# Patient Record
Sex: Male | Born: 1970 | Race: Black or African American | Hispanic: No | Smoking: Never smoker
Health system: Southern US, Community
[De-identification: ages and names within clinical notes are randomized; demographics above are authoritative.]

## PROBLEM LIST (undated history)

## (undated) DIAGNOSIS — D66 Hereditary factor VIII deficiency: Secondary | ICD-10-CM

## (undated) DIAGNOSIS — Z9989 Dependence on other enabling machines and devices: Secondary | ICD-10-CM

## (undated) DIAGNOSIS — E0500 Thyrotoxicosis with diffuse goiter without thyrotoxic crisis or storm: Secondary | ICD-10-CM

## (undated) DIAGNOSIS — E785 Hyperlipidemia, unspecified: Secondary | ICD-10-CM

## (undated) DIAGNOSIS — G4733 Obstructive sleep apnea (adult) (pediatric): Secondary | ICD-10-CM

## (undated) DIAGNOSIS — I10 Essential (primary) hypertension: Secondary | ICD-10-CM

## (undated) DIAGNOSIS — N200 Calculus of kidney: Secondary | ICD-10-CM

## (undated) DIAGNOSIS — R0600 Dyspnea, unspecified: Secondary | ICD-10-CM

## (undated) HISTORY — PX: TONSILLECTOMY: SUR1361

## (undated) HISTORY — DX: Hyperlipidemia, unspecified: E78.5

## (undated) HISTORY — DX: Hereditary factor VIII deficiency: D66

## (undated) HISTORY — DX: Essential (primary) hypertension: I10

## (undated) HISTORY — DX: Obstructive sleep apnea (adult) (pediatric): G47.33

## (undated) HISTORY — DX: Dependence on other enabling machines and devices: Z99.89

## (undated) HISTORY — DX: Dyspnea, unspecified: R06.00

---

## 2001-04-12 ENCOUNTER — Emergency Department (HOSPITAL_COMMUNITY): Admission: EM | Admit: 2001-04-12 | Discharge: 2001-04-12 | Payer: Self-pay | Admitting: Emergency Medicine

## 2001-04-12 ENCOUNTER — Encounter: Payer: Self-pay | Admitting: Emergency Medicine

## 2001-12-02 ENCOUNTER — Emergency Department (HOSPITAL_COMMUNITY): Admission: EM | Admit: 2001-12-02 | Discharge: 2001-12-02 | Payer: Self-pay | Admitting: Emergency Medicine

## 2001-12-02 ENCOUNTER — Encounter: Payer: Self-pay | Admitting: Emergency Medicine

## 2003-08-26 ENCOUNTER — Emergency Department (HOSPITAL_COMMUNITY): Admission: EM | Admit: 2003-08-26 | Discharge: 2003-08-26 | Payer: Self-pay | Admitting: Emergency Medicine

## 2003-08-30 ENCOUNTER — Emergency Department (HOSPITAL_COMMUNITY): Admission: EM | Admit: 2003-08-30 | Discharge: 2003-08-30 | Payer: Self-pay | Admitting: Family Medicine

## 2004-06-02 ENCOUNTER — Emergency Department (HOSPITAL_COMMUNITY): Admission: EM | Admit: 2004-06-02 | Discharge: 2004-06-02 | Payer: Self-pay | Admitting: Emergency Medicine

## 2005-02-18 ENCOUNTER — Encounter: Admission: RE | Admit: 2005-02-18 | Discharge: 2005-02-18 | Payer: Self-pay | Admitting: Orthopedic Surgery

## 2005-10-25 ENCOUNTER — Emergency Department (HOSPITAL_COMMUNITY): Admission: EM | Admit: 2005-10-25 | Discharge: 2005-10-25 | Payer: Self-pay | Admitting: *Deleted

## 2005-12-27 ENCOUNTER — Emergency Department (HOSPITAL_COMMUNITY): Admission: EM | Admit: 2005-12-27 | Discharge: 2005-12-27 | Payer: Self-pay | Admitting: Family Medicine

## 2005-12-27 ENCOUNTER — Ambulatory Visit (HOSPITAL_COMMUNITY): Admission: RE | Admit: 2005-12-27 | Discharge: 2005-12-27 | Payer: Self-pay | Admitting: Family Medicine

## 2006-01-03 ENCOUNTER — Emergency Department (HOSPITAL_COMMUNITY): Admission: EM | Admit: 2006-01-03 | Discharge: 2006-01-03 | Payer: Self-pay | Admitting: Emergency Medicine

## 2006-10-06 ENCOUNTER — Emergency Department (HOSPITAL_COMMUNITY): Admission: EM | Admit: 2006-10-06 | Discharge: 2006-10-06 | Payer: Self-pay | Admitting: Family Medicine

## 2007-05-13 ENCOUNTER — Emergency Department (HOSPITAL_COMMUNITY): Admission: EM | Admit: 2007-05-13 | Discharge: 2007-05-13 | Payer: Self-pay | Admitting: Family Medicine

## 2008-04-13 ENCOUNTER — Ambulatory Visit: Payer: Self-pay | Admitting: Internal Medicine

## 2008-04-13 DIAGNOSIS — G4733 Obstructive sleep apnea (adult) (pediatric): Secondary | ICD-10-CM | POA: Insufficient documentation

## 2008-05-03 ENCOUNTER — Ambulatory Visit (HOSPITAL_COMMUNITY): Admission: RE | Admit: 2008-05-03 | Discharge: 2008-05-03 | Payer: Self-pay | Admitting: Interventional Cardiology

## 2008-05-04 DIAGNOSIS — I119 Hypertensive heart disease without heart failure: Secondary | ICD-10-CM | POA: Insufficient documentation

## 2008-05-04 DIAGNOSIS — E785 Hyperlipidemia, unspecified: Secondary | ICD-10-CM | POA: Insufficient documentation

## 2008-05-05 ENCOUNTER — Encounter: Payer: Self-pay | Admitting: Internal Medicine

## 2008-05-05 ENCOUNTER — Ambulatory Visit (HOSPITAL_BASED_OUTPATIENT_CLINIC_OR_DEPARTMENT_OTHER): Admission: RE | Admit: 2008-05-05 | Discharge: 2008-05-05 | Payer: Self-pay | Admitting: Internal Medicine

## 2008-05-10 ENCOUNTER — Encounter (INDEPENDENT_AMBULATORY_CARE_PROVIDER_SITE_OTHER): Payer: Self-pay | Admitting: Interventional Cardiology

## 2008-05-10 ENCOUNTER — Ambulatory Visit (HOSPITAL_COMMUNITY): Admission: RE | Admit: 2008-05-10 | Discharge: 2008-05-10 | Payer: Self-pay | Admitting: Internal Medicine

## 2008-05-12 ENCOUNTER — Ambulatory Visit: Payer: Self-pay | Admitting: Internal Medicine

## 2008-05-17 ENCOUNTER — Ambulatory Visit: Payer: Self-pay | Admitting: Internal Medicine

## 2008-05-30 ENCOUNTER — Encounter: Payer: Self-pay | Admitting: Internal Medicine

## 2008-06-06 ENCOUNTER — Telehealth: Payer: Self-pay | Admitting: Internal Medicine

## 2008-06-21 ENCOUNTER — Ambulatory Visit: Payer: Self-pay | Admitting: Internal Medicine

## 2008-06-21 DIAGNOSIS — D66 Hereditary factor VIII deficiency: Secondary | ICD-10-CM

## 2008-10-22 ENCOUNTER — Ambulatory Visit: Payer: Self-pay | Admitting: Internal Medicine

## 2008-12-11 ENCOUNTER — Encounter: Payer: Self-pay | Admitting: Internal Medicine

## 2009-08-31 ENCOUNTER — Emergency Department (HOSPITAL_COMMUNITY): Admission: EM | Admit: 2009-08-31 | Discharge: 2009-09-01 | Payer: Self-pay | Admitting: Emergency Medicine

## 2009-09-05 ENCOUNTER — Encounter: Payer: Self-pay | Admitting: Internal Medicine

## 2009-10-23 ENCOUNTER — Telehealth: Payer: Self-pay | Admitting: Internal Medicine

## 2009-10-25 ENCOUNTER — Ambulatory Visit: Payer: Self-pay | Admitting: Internal Medicine

## 2009-10-25 DIAGNOSIS — R0602 Shortness of breath: Secondary | ICD-10-CM | POA: Insufficient documentation

## 2009-11-01 ENCOUNTER — Emergency Department (HOSPITAL_COMMUNITY): Admission: EM | Admit: 2009-11-01 | Discharge: 2009-11-01 | Payer: Self-pay | Admitting: Emergency Medicine

## 2009-12-09 ENCOUNTER — Telehealth: Payer: Self-pay | Admitting: Internal Medicine

## 2010-01-07 ENCOUNTER — Ambulatory Visit: Payer: Self-pay | Admitting: Internal Medicine

## 2010-02-24 ENCOUNTER — Ambulatory Visit: Payer: Self-pay | Admitting: Internal Medicine

## 2010-02-25 ENCOUNTER — Telehealth: Payer: Self-pay | Admitting: Adult Health

## 2010-02-27 ENCOUNTER — Ambulatory Visit: Payer: Self-pay | Admitting: Internal Medicine

## 2010-03-26 ENCOUNTER — Emergency Department (HOSPITAL_COMMUNITY)
Admission: EM | Admit: 2010-03-26 | Discharge: 2010-03-26 | Payer: Self-pay | Source: Home / Self Care | Admitting: Family Medicine

## 2010-04-15 NOTE — Assessment & Plan Note (Signed)
Summary: rov ///kp   Primary Provider/Referring Provider:  Camillo Flaming, jr  CC:  2 month follow up, cpap averages 4 hrs per night, BP  elevated, and pt c/o haeadaches.  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 Gets this also with season changes. Changes sides. Using a full face mask. Pressure is comfortable. Denies pollen related trouble. He states he is still falling asleep some in class although he is trying to use cpap 8 hours/night.  10/22/08- OSA, hemophilia Has noted big improvement in daytime alertnes, concentration and absence of morning headache using CPAP., He is used to it. Mask and pressure are comfortable.  October 25, 2009- OSA, hemophilia He wears CPAP every night. He sleeps 4 hours, gets up for 2 hours, then back to sleep another 4 hours. If he doesn't do this he says he is too groggy to function. Bedtime around 10PM. He doesn't know that he snores with CPAP, and says the seal is good. On a good day he still fights sleepiness. Dreams with sleep paralysis at times, but no cataplexy.Caffeine has no effect. Tried to start running and says lungs feel as if they " are in a box" without cough or wheeze. Never smoked, no lung disease. Denies chest pain or palpitation.   January 07, 2010- OSA, hemoptysis Nurse-CC: 2 month follow up, cpap averages 4 hrs per night, BP  elevated, pt c/o haeadaches Still pulls mask off but still thinks he averages about 4 hours/ night wearing CPAP and he can tell he feels better and more alert from using it.. Needs to lie on a wedge to avoid smothering I can't tell that he chokes, refluxes or wheezes while supine. . Today getting headaches and it raises his BP. This is not new. He claims BP will come right down as HA eases. We discussed sleep, headache and HBP.     Preventive Screening-Counseling & Management  Alcohol-Tobacco     Smoking Status: never  Current Medications (verified): 1)  Cpap 19  Advanced 2)  Hydrochlorothiazide 25 Mg Tabs (Hydrochlorothiazide) .... Take 1 By Mouth Once Daily 3)  Tricor 48 Mg Tabs (Fenofibrate) .... Take 1 By Mouth Once Daily 4)  Furosemide 20 Mg Tabs (Furosemide) .... Take 1 By Mouth Once Daily 5)  Pravachol 40 Mg Tabs (Pravastatin Sodium) .... Take 1 By Mouth Once Daily 6)  Amphetamine-Dextroamphetamine 30 Mg Xr24h-Cap (Amphetamine-Dextroamphetamine) .Marland Kitchen.. 1 Daily As Needed  Allergies (verified): No Known Drug Allergies  Past History:  Past Medical History: Last updated: 10/25/2009 Hyperlipidemia Hypertension hemophilia ObstructiveSleep Apnea- NPSG 05/05/08- AHI 92.5; CPAP 19/ AHI 0/hr Dyspnea  Past Surgical History: Last updated: 04/13/2008 Tonsillectomy  Family History: Last updated: 04/13/2008 parents living-2010 Nephew with sleep apnea  Social History: Last updated: 05/17/2008 Patient never smoked.  Separated In school- Actuary  [employer]  Risk Factors: Smoking Status: never (01/07/2010)  Review of Systems      See HPI  Vital Signs:  Patient profile:   40 year old male Height:      66 inches Weight:      241.8 pounds BMI:     39.17 O2 Sat:      97 % on Room air Pulse rate:   76 / minute BP sitting:   162 / 104  (left arm) Cuff size:   large  Vitals Entered By: Renold Genta RCP, LPN (January 07, 2010 11:12 AM)  O2 Flow:  Room air CC: 2 month follow up, cpap averages 4  hrs per night, BP  elevated, pt c/o haeadaches Comments Medications reviewed with patient Renold Genta RCP, LPN  January 07, 2010 11:15 AM    Physical Exam  Additional Exam:  General: A/Ox3; pleasant and cooperative, NAD, moderately overweight, quite alert and conversational   note BP 162/104 blamed on headache SKIN: no rash, lesions NODES: no lymphadenopathy HEENT: [place]/AT, EOM- WNL, Conjuctivae- clear, PERRLA, TM-WNL, Nose- clear, Throat- clear and wnl. Mallampti III- IV NECK: Supple w/ fair ROM, JVD- none, normal carotid  impulses w/o bruits Thyroid- CHEST: Clear to P&A HEART: RRR, no m/g/r heard ABDOMEN: Soft and nl;  ZOX:WRUE, nl pulses, no edema  NEURO: Grossly intact to observation.      Impression & Recommendations:  Problem # 1:  HYPERSOMNIA WITH SLEEP APNEA UNSPECIFIED (ICD-780.53)  We will continue CPAP at 19. I am going to have him try samples of Nuvigil to see if it can help with alertness w/o the pressure stimulation.  I am concerned about BP he attributes to his headache today. He is to see his primary MD.  Orders: Est. Patient Level IV (45409)  Medications Added to Medication List This Visit: 1)  Nuvigil 150 Mg Tabs (Armodafinil) .Marland Kitchen.. 1-2 daily as needed for alertness  Patient Instructions: 1)  Please schedule a follow-up appointment in 1 month. 2)  Instead of Adderall, try Nuvigil 150 mg samples, 3)   1 or 2 daily if needed.  4)  Continue CPAP as much of all night every night as possible.  Prescriptions: NUVIGIL 150 MG TABS (ARMODAFINIL) 1-2 daily as needed for alertness  #1 pack x 0   Entered and Authorized by:   Waymon Budge MD   Signed by:   Waymon Budge MD on 01/07/2010   Method used:   Samples Given   RxID:   [id number]

## 2010-04-15 NOTE — Assessment & Plan Note (Signed)
Summary: rov/jd   Primary Provider/Referring Provider:  Camillo Flaming, jr  CC:  Follow up visit-CPAP usage is limited due to taking off inthe mid of night-not realizing; pt states he is sleeping for approx 4-5 hours waking up for approx 2 hours and then goes back to sleep for another 4-5 hours..  History of Present Illness: 05/17/08-OSA NPSG 05/05/08- 92.5/hr with loud snore and desat.CPAP titrated to 19 for AHI 0/hr. Discussed implications and alternatives.He is very concerned about his class performance because of fatigue.   06/21/08- OSA, hemophilia Noting epistaxis from cpap even with humidifer. Gets this also with season changes. Changes sides. Using a full face mask. Pressure is comfortable. Denies pollen related trouble. He states he is still falling asleep some in class although he is trying to use cpap 8 hours/night.  10/22/08- OSA, hemophilia Has noted big improvement in daytime alertnes, concentration and absence of morning headache using CPAP., He is used to it. Mask and pressure are comfortable.  October 25, 2009- OSA, hemophilia He wears CPAP every night. He sleeps 4 hours, gets up for 2 hours, then back to sleep another 4 hours. If he doesn't do this he says he is too groggy to function. Bedtime around 10PM. He doesn't know that he snores with CPAP, and says the seal is good. On a good day he still fights sleepiness. Dreams with sleep paralysis at times, but no cataplexy.Caffeine has no effect. Tried to start running and says lungs feel as if they " are in a box" without cough or wheeze. Never smoked, no lung disease. Denies chest pain or palpitation.   Preventive Screening-Counseling & Management  Alcohol-Tobacco     Smoking Status: never  Current Medications (verified): 1)  Cpap 19 Advanced 2)  Hydrochlorothiazide 25 Mg Tabs (Hydrochlorothiazide) .... Take 1 By Mouth Once Daily 3)  Tricor 48 Mg Tabs (Fenofibrate) .... Take 1 By Mouth Once Daily 4)  Furosemide 20 Mg Tabs  (Furosemide) .... Take 1 By Mouth Once Daily 5)  Pravachol 40 Mg Tabs (Pravastatin Sodium) .... Take 1 By Mouth Once Daily  Allergies (verified): No Known Drug Allergies  Past History:  Past Surgical History: Last updated: 04/13/2008 Tonsillectomy  Family History: Last updated: 04/13/2008 parents living-2010 Nephew with sleep apnea  Social History: Last updated: 05/17/2008 Patient never smoked.  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  The patient denies anorexia, fever, weight loss, weight gain, vision loss, decreased hearing, hoarseness, chest pain, syncope, peripheral edema, prolonged cough, headaches, hemoptysis, abdominal pain, and severe indigestion/heartburn.    Vital Signs:  Patient profile:   40 year old male Height:      66 inches Weight:      242 pounds BMI:     39.20 O2 Sat:      97 % on Room air Pulse rate:   79 / minute BP sitting:   130 / 84  (left arm) Cuff size:   large  Vitals Entered By: Reynaldo Minium CMA (October 25, 2009 2:53 PM)  O2 Flow:  Room air CC: Follow up visit-CPAP usage is limited due to taking off inthe mid of night-not realizing; pt states he is sleeping for approx 4-5 hours waking up for approx 2  hours and then goes back to sleep for another 4-5 hours.   Physical Exam  Additional Exam:  General: A/Ox3; pleasant and cooperative, NAD, moderately overweight, quite alert and conversational SKIN: no rash, lesions NODES: no lymphadenopathy HEENT: [place]/AT, EOM- WNL, Conjuctivae- clear, PERRLA, TM-WNL, Nose- clear, Throat- clear and wnl. Mallampti III- IV NECK: Supple w/ fair ROM, JVD- none, normal carotid impulses w/o bruits Thyroid- CHEST: Clear to  P&A HEART: RRR, no m/g/r heard ABDOMEN: Soft and nl;  QVZ:DGLO, nl pulses, no edema  NEURO: Grossly intact to observation. He was sound asleep when I came in the room. Alert once awake.      Impression & Recommendations:  Problem # 1:  HYPERSOMNIA WITH SLEEP APNEA UNSPECIFIED (ICD-780.53)  It sounds as if he is compliant and the CPAP pressure is adequate.  He has significant additional sleepiness which we will try addressing with adderall. I have discussed this as a controlled drug, with protential complications.  Problem # 2:  DYSPNEA (ICD-786.05)  Dyspnea with running, likely reflectrs conditioning. If it persists we can get PFT but for now we will get CXR.  Medications Added to Medication List This Visit: 1)  Hydrochlorothiazide 25 Mg Tabs (Hydrochlorothiazide) .... Take 1 by mouth once daily 2)  Tricor 48 Mg Tabs (Fenofibrate) .... Take 1 by mouth once daily 3)  Furosemide 20 Mg Tabs (Furosemide) .... Take 1 by mouth once daily 4)  Pravachol 40 Mg Tabs (Pravastatin sodium) .... Take 1 by mouth once daily 5)  Amphetamine-dextroamphetamine 30 Mg Xr24h-cap (Amphetamine-dextroamphetamine) .Marland Kitchen.. 1 daily as needed  Other Orders: Est. Patient Level IV (99214) T-2 View CXR (71020TC)  Patient Instructions: 1)  Please schedule a follow-up appointment in 1 month. 2)  Script for generic adderall. Take one each morning as needed to increase alertness. Don't take more than dose prescribed.  3)  A chest x-ray has been recommended.  Your imaging study may require preauthorization.  Prescriptions: AMPHETAMINE-DEXTROAMPHETAMINE 30 MG XR24H-CAP (AMPHETAMINE-DEXTROAMPHETAMINE) 1 daily as needed  #30 x 0   Entered and Authorized by:   Waymon Budge MD   Signed by:   Waymon Budge MD on 10/25/2009   Method used:   Print then Give to Patient   RxID:   [id number]

## 2010-04-15 NOTE — Progress Notes (Signed)
Summary: nos appt  Phone Note Call from Patient   Caller: [email or web address]  Call For: young Summary of Call: Rsc nos from 8/9 to 8/12 @ 2:15p. Initial call taken by: Darletta Moll,  October 23, 2009 9:55 AM

## 2010-04-15 NOTE — Progress Notes (Signed)
Summary: nos appt  Phone Note Call from Patient   Caller: [email or web address]  Call For: young Summary of Call: LMTCB x2 to rsc nos from 9/22. Initial call taken by: Darletta Moll,  December 09, 2009 10:59 AM

## 2010-04-15 NOTE — Letter (Signed)
Summary: CMN for CPAP Supplies/[facility]  CMN for CPAP Supplies/[facility]   Imported By: Sherian Rein 09/18/2009 13:55:56  _____________________________________________________________________  External Attachment:    Type:   Image     Comment:   External Document

## 2010-04-17 NOTE — Assessment & Plan Note (Signed)
Summary: Earl Williams   Primary Provider/Referring Provider:  Camillo Flaming, jr  CC:  Follow up visit-sleep no complaints..  History of Present Illness: October 25, 2009- OSA, hemophilia He wears CPAP every night. He sleeps 4 hours, gets up for 2 hours, then back to sleep another 4 hours. If he doesn't do this he says he is too groggy to function. Bedtime around 10PM. He doesn't know that he snores with CPAP, and says the seal is good. On a good day he still fights sleepiness. Dreams with sleep paralysis at times, but no cataplexy.Caffeine has no effect. Tried to start running and says lungs feel as if they " are in a box" without cough or wheeze. Never smoked, no lung disease. Denies chest pain or palpitation.   January 07, 2010- OSA, hemoptysis Nurse-CC: 2 month follow up, cpap averages 4 hrs per night, BP  elevated, pt c/o haeadaches Still pulls mask off but still thinks he averages about 4 hours/ night wearing CPAP and he can tell he feels better and more alert from using it.. Needs to lie on a wedge to avoid smothering I can't tell that he chokes, refluxes or wheezes while supine. . Today getting headaches and it raises his BP. This is not new. He claims BP will come right down as HA eases. We discussed sleep, headache and HBP.   February 27, 2010- OSA, hemoptysis  ............daughter here Nurse-CC: Follow up visit-sleep no complaints. Neuronitin is a defintie help and imporoives his focus and concentration. It is not causing headaches.  He continues using CPAP all night every night. He still pulls it off in sleep at times unaware.  His daughter volunteers that he pulls mask off in sleep and will start snoring, doesn't snore through it, and is not especailly restless in sleep.  He no longer shakes in sleep and has not coughed up any more blood.     Preventive Screening-Counseling & Management  Alcohol-Tobacco     Smoking Status: never  Current Medications (verified): 1)  Cpap 19  Advanced 2)  Hydrochlorothiazide 25 Mg Tabs (Hydrochlorothiazide) .... Take 1 By Mouth Once Daily 3)  Tricor 48 Mg Tabs (Fenofibrate) .... Take 1 By Mouth Once Daily 4)  Furosemide 20 Mg Tabs (Furosemide) .... Take 1 By Mouth Once Daily 5)  Pravachol 40 Mg Tabs (Pravastatin Sodium) .... Take 1 By Mouth Once Daily 6)  Nuvigil 150 Mg Tabs (Armodafinil) .Marland Kitchen.. 1-2 Daily As Needed For Alertness  Allergies (verified): No Known Drug Allergies  Past History:  Past Medical History: Last updated: 10/25/2009 Hyperlipidemia Hypertension hemophilia ObstructiveSleep Apnea- NPSG 05/05/08- AHI 92.5; CPAP 19/ AHI 0/hr Dyspnea  Past Surgical History: Last updated: 04/13/2008 Tonsillectomy  Family History: Last updated: 04/13/2008 parents living-2010 Nephew with sleep apnea  Social History: Last updated: 05/17/2008 Patient never smoked.  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    Vital Signs:  Patient profile:   40 year old male Height:      66 inches Weight:      247.38 pounds BMI:     40.07 O2 Sat:      97 %  on Room air Pulse rate:   76 / minute BP sitting:   148 / 90  (left arm) Cuff size:   large  Vitals Entered By: Reynaldo Minium CMA (February 27, 2010 4:59 PM)  O2 Flow:  Room air CC: Follow up visit-sleep no complaints.   Physical Exam  Additional Exam:  General: A/Ox3; pleasant and cooperative, NAD, moderately overweight, quite alert and conversational   SKIN: no rash, lesions NODES: no  lymphadenopathy HEENT: [place]/AT, EOM- WNL, Conjuctivae- clear, PERRLA, TM-WNL, Nose- clear, Throat- clear and wnl. Mallampti III- IV NECK: Supple w/ fair ROM, JVD- none, normal carotid impulses w/o bruits Thyroid- CHEST: Clear to P&A HEART: RRR, no m/g/r heard ABDOMEN: Soft and nl;  GEX:BMWU, nl pulses, no edema  NEURO: Grossly intact to observation.      Impression & Recommendations:  Problem # 1:  HYPERSOMNIA WITH SLEEP APNEA UNSPECIFIED (ICD-780.53)  Now doing very well with voluntary compliance and control. We  will let him try temazepam at night to see if that makes him more relaxed so he doesn't pull on mask. He will continue Neuronitn for daytime use if needed.   Problem # 2:  DYSPNEA (ICD-786.05)  Dyspnea, choking and hemoptysis have stopped.   Medications Added to Medication List This Visit: 1)  Temazepam 15 Mg Caps (Temazepam) .Marland Kitchen.. 1 for sleep if needed  Other Orders: Est. Patient Level III (13244)  Patient Instructions: 1)  Please schedule a follow-up appointment in 6 months. 2)  Continue CPAP all night every night at 19 3)  Try script Temazepam to help you sleep a little more soundly. See if that reduces your tendency to pull off your mask so much.  Prescriptions: TEMAZEPAM 15 MG CAPS (TEMAZEPAM) 1 for sleep if needed  #30 x 5   Entered and Authorized by:   Waymon Budge MD   Signed by:   Waymon Budge MD on 02/27/2010   Method used:   Print then Give to Patient   RxID:   [id number]

## 2010-04-17 NOTE — Progress Notes (Signed)
Summary: nos appt  Phone Note Call from Patient   Caller: [email or web address]  Call For: young Summary of Call: Rsc nos from 12/12 to 12/15. Initial call taken by: Darletta Moll,  February 25, 2010 4:02 PM

## 2010-05-25 ENCOUNTER — Emergency Department (HOSPITAL_COMMUNITY)
Admission: EM | Admit: 2010-05-25 | Discharge: 2010-05-25 | Disposition: A | Discharge status: Home / Self Care | Payer: Medicaid Other | Attending: Emergency Medicine | Admitting: Emergency Medicine

## 2010-05-25 DIAGNOSIS — I10 Essential (primary) hypertension: Secondary | ICD-10-CM | POA: Insufficient documentation

## 2010-05-25 DIAGNOSIS — Y92009 Unspecified place in unspecified non-institutional (private) residence as the place of occurrence of the external cause: Secondary | ICD-10-CM | POA: Insufficient documentation

## 2010-05-25 DIAGNOSIS — W230XXA Caught, crushed, jammed, or pinched between moving objects, initial encounter: Secondary | ICD-10-CM | POA: Insufficient documentation

## 2010-05-25 DIAGNOSIS — S6000XA Contusion of unspecified finger without damage to nail, initial encounter: Secondary | ICD-10-CM | POA: Insufficient documentation

## 2010-06-01 LAB — COMPREHENSIVE METABOLIC PANEL
ALT: 52 U/L (ref 0–53)
BUN: 17 mg/dL (ref 6–23)
CO2: 28 mEq/L (ref 19–32)
Calcium: 9.1 mg/dL (ref 8.4–10.5)
GFR calc non Af Amer: 53 mL/min — ABNORMAL LOW (ref >60)
Glucose, Bld: 130 mg/dL — ABNORMAL HIGH (ref 70–99)
Sodium: 135 mEq/L (ref 135–145)

## 2010-06-01 LAB — URINALYSIS, ROUTINE W REFLEX MICROSCOPIC
Bilirubin Urine: NEGATIVE
Hgb urine dipstick: NEGATIVE
Ketones, ur: NEGATIVE mg/dL
Protein, ur: NEGATIVE mg/dL
Specific Gravity, Urine: 1.024 (ref 1.005–1.030)
Urobilinogen, UA: 0.2 mg/dL (ref 0.0–1.0)

## 2010-06-17 ENCOUNTER — Telehealth: Payer: Self-pay | Admitting: Internal Medicine

## 2010-06-17 DIAGNOSIS — I10 Essential (primary) hypertension: Secondary | ICD-10-CM

## 2010-06-17 DIAGNOSIS — G4710 Hypersomnia, unspecified: Secondary | ICD-10-CM

## 2010-06-17 NOTE — Telephone Encounter (Signed)
Left message to call back  

## 2010-06-18 NOTE — Telephone Encounter (Signed)
[facility] Need to enter name of DME company for our record. Phone from patient indicated pressure too high.

## 2010-06-18 NOTE — Telephone Encounter (Signed)
Pt says the pressure on CPAP seems too high. He is still waking up, trying to remove his mask. He would also like a referral to [facility] due to HBP. Please advise.

## 2010-06-18 NOTE — Telephone Encounter (Signed)
Left message for pt to call back and speak with me regarding his DME company.Vivianne Spence

## 2010-06-19 NOTE — Telephone Encounter (Signed)
Order was sent to [facility] for CPAP pressure to be changed.  DME updated in system.  Pt aware.  Pt is asking if Dr. Maple Hudson will refer him to [facility] for Hypertension. He states that he does have a PCP but they are taking to long to get back with him regarding this.  Please advise thanks!

## 2010-06-23 NOTE — Telephone Encounter (Signed)
LMOMTCB to inform order has been sent

## 2010-06-23 NOTE — Telephone Encounter (Signed)
OK to make him a referral to [facility] for Hypertension at his request.

## 2010-06-24 NOTE — Telephone Encounter (Signed)
[email or web address] 

## 2010-06-25 NOTE — Telephone Encounter (Signed)
Spoke w/ pt and advised him order was sent and someone will be contacting him. He verbalized understanding and needed nothing else further

## 2010-07-09 ENCOUNTER — Encounter: Payer: Self-pay | Admitting: Cardiovascular Disease

## 2010-07-10 ENCOUNTER — Ambulatory Visit: Payer: Medicaid Other | Admitting: Cardiovascular Disease

## 2010-07-22 ENCOUNTER — Encounter: Payer: Self-pay | Admitting: Cardiovascular Disease

## 2010-07-29 NOTE — Procedures (Signed)
NAME:  Earl Williams, Earl Williams         ACCOUNT NO.:  [id number]   MEDICAL RECORD NO.:  [id number]          PATIENT TYPE:  OUT   LOCATION:  SLEEP CENTER                 FACILITY:  [facility]   PHYSICIAN:  Clinton D. Maple Hudson, MD, FCCP, FACPDATE OF BIRTH:  12-07-1969   DATE OF STUDY:  05/05/2008                            NOCTURNAL POLYSOMNOGRAM   REFERRING PHYSICIAN:  Clinton D. Young, MD, FCCP, FACP   INDICATIONS FOR STUDY:  Hypersomnia with sleep apnea.  Epworth  sleepiness score 16/24, BMI 37.8, weight 234 pounds, height 66 inches,  and neck 16.5 inches.  Home medication charted and reviewed.   SLEEP ARCHITECTURE:  Split study protocol.  During the diagnostic phase,  total sleep time was 133 minutes with sleep efficiency 80.2%.  Stage I  was 3.4%, stage II 96.6%, stage III and REM were absent.  Sleep latency  1.5 minutes, awake after sleep onset 10.5 minutes, and arousal index  19.8.  No bedtime medication was taken.   RESPIRATORY DATA:  Split study protocol.  Apnea/hypopnea index (AHI)  92.1 per hour.  A total of 205 events was scored, all as hypopneas.  Events were not positional.  CPAP was then titrated to 16 CWP, AHI 10  per hour, 113 minutes was recorded at this pressure level, but  technicians free test report indicates he was titrated to 19 CWP as  recommended final pressure.  He wore a medium ResMed full-face Quattro  mask with heated humidifier.   OXYGEN DATA:  Loud snoring before CPAP with oxygen desaturation to a  nadir of 70%.  After CPAP control, mean oxygen saturation held 94% on  room air.   CARDIAC DATA:  Sinus rhythm.   MOVEMENT/PARASOMNIA:  No significant movement disturbance.  Bathroom x1.   IMPRESSION/RECOMMENDATIONS:  1. Severe obstructive sleep apnea/hypopnea syndrome, AHI 92.5 per      hour, all hypopneas, non positional.  Loud snoring with oxygen      desaturation to a nadir of 70%.  2. CPAP titrated to 19 CWP, AHI 0 per hour.  He wore a medium ResMed  full-face Quattro mask with heated humidifier.      Clinton D. Maple Hudson, MD, [facility], FACP  Diplomate, Biomedical engineer of Sleep Medicine  Electronically Signed     CDY/MEDQ  D:  05/12/2008 10:37:35  T:  05/12/2008 20:31:27  Job:  [id number]

## 2010-08-01 NOTE — Letter (Signed)
June 04, 2008     RE:  Earl Williams, Earl Williams  MRN:  [id number]  /  DOB:  [birth year]   To Whom It May Concern:   I am a specialist in Sleep Disorders Medicine and I have been treating  Earl Williams for a significant sleep disorder associated with daytime  sleepiness and impaired concentration.  We have begun specific therapy  and I anticipate substantial improvement, hopefully within the next few  weeks.  Please be aware that he has been dealing with a documented  medical problem and may require additional time for tests.    Sincerely,      Clinton D. Maple Hudson, MD, [facility], FACP  Electronically Signed    CDY/MedQ  DD: 06/04/2008  DT: 06/05/2008  Job #: [id number]

## 2010-08-14 ENCOUNTER — Encounter: Payer: Self-pay | Admitting: Cardiovascular Disease

## 2010-08-14 ENCOUNTER — Ambulatory Visit (INDEPENDENT_AMBULATORY_CARE_PROVIDER_SITE_OTHER): Payer: Medicaid Other | Admitting: Cardiovascular Disease

## 2010-08-14 DIAGNOSIS — I493 Ventricular premature depolarization: Secondary | ICD-10-CM

## 2010-08-14 DIAGNOSIS — R0602 Shortness of breath: Secondary | ICD-10-CM

## 2010-08-14 DIAGNOSIS — Z79899 Other long term (current) drug therapy: Secondary | ICD-10-CM

## 2010-08-14 DIAGNOSIS — E785 Hyperlipidemia, unspecified: Secondary | ICD-10-CM

## 2010-08-14 DIAGNOSIS — I4949 Other premature depolarization: Secondary | ICD-10-CM

## 2010-08-14 DIAGNOSIS — I10 Essential (primary) hypertension: Secondary | ICD-10-CM

## 2010-08-14 NOTE — Assessment & Plan Note (Signed)
Will check BMET and Mag.  Will adjust meds based on results of ETT and echo

## 2010-08-14 NOTE — Assessment & Plan Note (Signed)
Functional from obesity and being sedentary.  F/U Dr Maple Hudson Continue CPAP

## 2010-08-14 NOTE — Patient Instructions (Signed)
Your physician recommends that you schedule a follow-up appointment in: WILL DECIDE AFTER  TESTS DONE  Your physician recommends that you continue on your current medications as directed. Please refer to the Current Medication list given to you today.  FASTING  LIPID, LIVER,BMET, AND  MAG  DX 401.1 V58.69  272.4  AT PT'S CONVENIENCE  Your physician has requested that you have an echocardiogram. Echocardiography is a painless test that uses sound waves to create images of your heart. It provides your doctor with information about the size and shape of your heart and how well your heart's chambers and valves are working. This procedure takes approximately one hour. There are no restrictions for this procedure.     Your physician has requested that you have an exercise tolerance test. For further information please visit [email or web address] Please also follow instruction sheet, as given.  WITH DR Eden Emms ECHO SAME DAY AT PT'S CONVENIENCE

## 2010-08-14 NOTE — Progress Notes (Signed)
40 yo referred by Dr Maple Hudson for HTN.  Previously seen by Dr Bruna Potter.  Sees Dr Maple Hudson for sleep apnea and CPAP.  Sedentary and overweight.  Rx for BP many years.  On diuretic with no KCL.  No labs in over a year.  Elevated lipids with statin Rx.  Has some exertional dyspnea that sounds functional.  Does not think his BP is well controlled with any level of activity.  Denies SSCP.  In office had frequent PVC;s that were asymptomatic.  No palpitations, edema, syncope.  Was concerned about PAD but his legs fall asleep when sitting for a prolonged period of time not with ambulation.  Sounds more related to back  ROS: Denies fever, malais, weight loss, blurry vision, decreased visual acuity, cough, sputum, SOB, hemoptysis, pleuritic pain, palpitaitons, heartburn, abdominal pain, melena, lower extremity edema, claudication, or rash.  All other systems reviewed and negative   General: Affect appropriate Healthy:  appears stated age HEENT: normal Neck supple with no adenopathy JVP normal no bruits no thyromegaly Lungs clear with no wheezing and good diaphragmatic motion Heart:  S1/S2 no murmur,rub, gallop or click PMI normal Abdomen: benighn, BS positve, no tenderness, no AAA no bruit.  No HSM or HJR Distal pulses intact with no bruits No edema Neuro non-focal Skin warm and dry No muscular weakness  Medications Current Outpatient Prescriptions  Medication Sig Dispense Refill  . Armodafinil (NUVIGIL) 150 MG tablet Take 150 mg by mouth daily as needed.        . fenofibrate (TRICOR) 48 MG tablet Take 48 mg by mouth daily.        . furosemide (LASIX) 20 MG tablet Take 20 mg by mouth as needed.       . pravastatin (PRAVACHOL) 40 MG tablet Take 40 mg by mouth daily.        . temazepam (RESTORIL) 15 MG capsule Take 15 mg by mouth at bedtime as needed.        Marland Kitchen DISCONTD: hydrochlorothiazide 25 MG tablet Take 25 mg by mouth daily.          Allergies Review of patient's allergies indicates not on  file.  Family History: No family history on file.  Social History: History   Social History  . Marital Status: Married    Spouse Name: N/A    Number of Children: N/A  . Years of Education: N/A   Occupational History  . Not on file.   Social History Main Topics  . Smoking status: Never Smoker   . Smokeless tobacco: Not on file  . Alcohol Use: Not on file  . Drug Use: Not on file  . Sexually Active: Not on file   Other Topics Concern  . Not on file   Social History Narrative  . No narrative on file    Electrocardiogram:  NSR 82 frequent PVC  Assessment and Plan

## 2010-08-14 NOTE — Assessment & Plan Note (Signed)
F/U ETT to see BP response and make sure PVC;s suppressed.  Echo to assess LV mass and R/O structural heart disease

## 2010-08-14 NOTE — Assessment & Plan Note (Signed)
Fasting lipid and liver profile next week when fasting.

## 2010-08-24 ENCOUNTER — Emergency Department (HOSPITAL_COMMUNITY)
Admission: EM | Admit: 2010-08-24 | Discharge: 2010-08-25 | Disposition: A | Discharge status: Home / Self Care | Payer: Medicaid Other | Attending: Emergency Medicine | Admitting: Emergency Medicine

## 2010-08-24 DIAGNOSIS — D66 Hereditary factor VIII deficiency: Secondary | ICD-10-CM | POA: Insufficient documentation

## 2010-08-24 DIAGNOSIS — M79609 Pain in unspecified limb: Secondary | ICD-10-CM | POA: Insufficient documentation

## 2010-08-24 DIAGNOSIS — M7989 Other specified soft tissue disorders: Secondary | ICD-10-CM | POA: Insufficient documentation

## 2010-08-24 DIAGNOSIS — I10 Essential (primary) hypertension: Secondary | ICD-10-CM | POA: Insufficient documentation

## 2010-08-29 ENCOUNTER — Other Ambulatory Visit: Payer: Medicaid Other

## 2010-08-30 ENCOUNTER — Inpatient Hospital Stay (INDEPENDENT_AMBULATORY_CARE_PROVIDER_SITE_OTHER)
Admission: RE | Admit: 2010-08-30 | Discharge: 2010-08-30 | Disposition: A | Discharge status: Home / Self Care | Payer: Medicaid Other | Source: Ambulatory Visit | Attending: Family Medicine | Admitting: Family Medicine

## 2010-08-30 DIAGNOSIS — R609 Edema, unspecified: Secondary | ICD-10-CM

## 2010-08-30 DIAGNOSIS — L259 Unspecified contact dermatitis, unspecified cause: Secondary | ICD-10-CM

## 2010-08-30 DIAGNOSIS — M79609 Pain in unspecified limb: Secondary | ICD-10-CM

## 2010-08-30 LAB — CBC
Platelets: 216 10*3/uL (ref 150–400)
RBC: 4.35 MIL/uL (ref 4.22–5.81)
WBC: 8 10*3/uL (ref 4.0–10.5)

## 2010-08-30 LAB — DIFFERENTIAL
Basophils Relative: 0 % (ref 0–1)
Eosinophils Absolute: 0.4 10*3/uL (ref 0.0–0.7)
Lymphs Abs: 2.1 10*3/uL (ref 0.7–4.0)
Neutro Abs: 5 10*3/uL (ref 1.7–7.7)
Neutrophils Relative %: 63 % (ref 43–77)

## 2010-08-30 LAB — COMPREHENSIVE METABOLIC PANEL
ALT: 78 U/L — ABNORMAL HIGH (ref 0–53)
AST: 33 U/L (ref 0–37)
Alkaline Phosphatase: 71 U/L (ref 39–117)
CO2: 27 mEq/L (ref 19–32)
Chloride: 103 mEq/L (ref 96–112)
GFR calc non Af Amer: >60 mL/min (ref >60)
Sodium: 138 mEq/L (ref 135–145)
Total Bilirubin: 0.4 mg/dL (ref 0.3–1.2)

## 2010-09-01 ENCOUNTER — Other Ambulatory Visit (INDEPENDENT_AMBULATORY_CARE_PROVIDER_SITE_OTHER): Payer: Medicaid Other | Admitting: *Deleted

## 2010-09-01 ENCOUNTER — Telehealth: Payer: Self-pay | Admitting: Cardiovascular Disease

## 2010-09-01 DIAGNOSIS — I10 Essential (primary) hypertension: Secondary | ICD-10-CM

## 2010-09-01 DIAGNOSIS — Z79899 Other long term (current) drug therapy: Secondary | ICD-10-CM

## 2010-09-01 DIAGNOSIS — I493 Ventricular premature depolarization: Secondary | ICD-10-CM

## 2010-09-01 DIAGNOSIS — I4949 Other premature depolarization: Secondary | ICD-10-CM

## 2010-09-01 LAB — BASIC METABOLIC PANEL
BUN: 24 mg/dL — ABNORMAL HIGH (ref 6–23)
CO2: 24 mEq/L (ref 19–32)
Chloride: 98 mEq/L (ref 96–112)
Creatinine, Ser: 1.3 mg/dL (ref 0.4–1.5)

## 2010-09-01 LAB — HEPATIC FUNCTION PANEL
Albumin: 4.1 g/dL (ref 3.5–5.2)
Alkaline Phosphatase: 66 U/L (ref 39–117)
Bilirubin, Direct: 0.2 mg/dL (ref 0.0–0.3)

## 2010-09-01 LAB — LIPID PANEL
LDL Cholesterol: 50 mg/dL (ref 0–99)
Total CHOL/HDL Ratio: 4
Triglycerides: 151 mg/dL — ABNORMAL HIGH (ref 0.0–149.0)

## 2010-09-01 NOTE — Telephone Encounter (Signed)
Left message for pt to come to [address] for labs. If that is not convenient for him, he is to call back Deliah Goody

## 2010-09-01 NOTE — Telephone Encounter (Signed)
Pt wants to know where does he have to go for blood work.

## 2010-09-03 ENCOUNTER — Emergency Department (HOSPITAL_COMMUNITY)
Admission: EM | Admit: 2010-09-03 | Discharge: 2010-09-04 | Discharge status: Against Medical Advice | Payer: Medicaid Other | Attending: Emergency Medicine | Admitting: Emergency Medicine

## 2010-09-03 DIAGNOSIS — E860 Dehydration: Secondary | ICD-10-CM | POA: Insufficient documentation

## 2010-09-03 DIAGNOSIS — S40019A Contusion of unspecified shoulder, initial encounter: Secondary | ICD-10-CM | POA: Insufficient documentation

## 2010-09-03 DIAGNOSIS — I959 Hypotension, unspecified: Secondary | ICD-10-CM | POA: Insufficient documentation

## 2010-09-03 DIAGNOSIS — I10 Essential (primary) hypertension: Secondary | ICD-10-CM | POA: Insufficient documentation

## 2010-09-03 DIAGNOSIS — D66 Hereditary factor VIII deficiency: Secondary | ICD-10-CM | POA: Insufficient documentation

## 2010-09-03 DIAGNOSIS — X58XXXA Exposure to other specified factors, initial encounter: Secondary | ICD-10-CM | POA: Insufficient documentation

## 2010-09-03 DIAGNOSIS — IMO0002 Reserved for concepts with insufficient information to code with codable children: Secondary | ICD-10-CM | POA: Insufficient documentation

## 2010-09-03 DIAGNOSIS — F411 Generalized anxiety disorder: Secondary | ICD-10-CM | POA: Insufficient documentation

## 2010-09-03 DIAGNOSIS — N289 Disorder of kidney and ureter, unspecified: Secondary | ICD-10-CM | POA: Insufficient documentation

## 2010-09-03 LAB — DIFFERENTIAL
Basophils Relative: 0 % (ref 0–1)
Eosinophils Absolute: 0.3 10*3/uL (ref 0.0–0.7)
Eosinophils Relative: 3 % (ref 0–5)
Monocytes Absolute: 1.1 10*3/uL — ABNORMAL HIGH (ref 0.1–1.0)
Monocytes Relative: 11 % (ref 3–12)
Neutrophils Relative %: 54 % (ref 43–77)

## 2010-09-03 LAB — CBC
MCH: 32 pg (ref 26.0–34.0)
MCHC: 35.2 g/dL (ref 30.0–36.0)
Platelets: 276 10*3/uL (ref 150–400)
RBC: 3.87 MIL/uL — ABNORMAL LOW (ref 4.22–5.81)
RDW: 12.2 % (ref 11.5–15.5)

## 2010-09-04 ENCOUNTER — Observation Stay (HOSPITAL_COMMUNITY)
Admission: EM | Admit: 2010-09-04 | Discharge: 2010-09-09 | Disposition: A | Discharge status: Home / Self Care | Payer: Medicaid Other | Attending: Internal Medicine | Admitting: Internal Medicine

## 2010-09-04 ENCOUNTER — Emergency Department (HOSPITAL_COMMUNITY): Payer: Medicaid Other

## 2010-09-04 DIAGNOSIS — G4733 Obstructive sleep apnea (adult) (pediatric): Secondary | ICD-10-CM | POA: Insufficient documentation

## 2010-09-04 DIAGNOSIS — E785 Hyperlipidemia, unspecified: Secondary | ICD-10-CM | POA: Insufficient documentation

## 2010-09-04 DIAGNOSIS — I959 Hypotension, unspecified: Secondary | ICD-10-CM | POA: Insufficient documentation

## 2010-09-04 DIAGNOSIS — I10 Essential (primary) hypertension: Secondary | ICD-10-CM | POA: Insufficient documentation

## 2010-09-04 DIAGNOSIS — N179 Acute kidney failure, unspecified: Principal | ICD-10-CM | POA: Insufficient documentation

## 2010-09-04 DIAGNOSIS — D509 Iron deficiency anemia, unspecified: Secondary | ICD-10-CM | POA: Insufficient documentation

## 2010-09-04 DIAGNOSIS — E860 Dehydration: Secondary | ICD-10-CM | POA: Insufficient documentation

## 2010-09-04 DIAGNOSIS — D66 Hereditary factor VIII deficiency: Secondary | ICD-10-CM | POA: Insufficient documentation

## 2010-09-04 DIAGNOSIS — Z79899 Other long term (current) drug therapy: Secondary | ICD-10-CM | POA: Insufficient documentation

## 2010-09-04 DIAGNOSIS — R5381 Other malaise: Secondary | ICD-10-CM | POA: Insufficient documentation

## 2010-09-04 DIAGNOSIS — R609 Edema, unspecified: Secondary | ICD-10-CM | POA: Insufficient documentation

## 2010-09-04 DIAGNOSIS — Z01812 Encounter for preprocedural laboratory examination: Secondary | ICD-10-CM | POA: Insufficient documentation

## 2010-09-04 DIAGNOSIS — E0590 Thyrotoxicosis, unspecified without thyrotoxic crisis or storm: Secondary | ICD-10-CM | POA: Insufficient documentation

## 2010-09-04 LAB — DIFFERENTIAL
Basophils Absolute: 0 10*3/uL (ref 0.0–0.1)
Basophils Relative: 0 % (ref 0–1)
Eosinophils Relative: 3 % (ref 0–5)
Lymphocytes Relative: 35 % (ref 12–46)
Monocytes Absolute: 1.1 10*3/uL — ABNORMAL HIGH (ref 0.1–1.0)
Monocytes Relative: 11 % (ref 3–12)

## 2010-09-04 LAB — BASIC METABOLIC PANEL
CO2: 28 mEq/L (ref 19–32)
Chloride: 95 mEq/L — ABNORMAL LOW (ref 96–112)
GFR calc Af Amer: 51 mL/min — ABNORMAL LOW (ref >60)
Potassium: 4 mEq/L (ref 3.5–5.1)
Sodium: 135 mEq/L (ref 135–145)

## 2010-09-04 LAB — URINALYSIS, ROUTINE W REFLEX MICROSCOPIC
Bilirubin Urine: NEGATIVE
Bilirubin Urine: NEGATIVE
Glucose, UA: NEGATIVE mg/dL
Hgb urine dipstick: NEGATIVE
Hgb urine dipstick: NEGATIVE
Ketones, ur: NEGATIVE mg/dL
Protein, ur: NEGATIVE mg/dL
Specific Gravity, Urine: 1.016 (ref 1.005–1.030)
Urobilinogen, UA: 0.2 mg/dL (ref 0.0–1.0)
pH: 6 (ref 5.0–8.0)

## 2010-09-04 LAB — URINE MICROSCOPIC-ADD ON

## 2010-09-04 LAB — COMPREHENSIVE METABOLIC PANEL
ALT: 77 U/L — ABNORMAL HIGH (ref 0–53)
AST: 34 U/L (ref 0–37)
Albumin: 3.5 g/dL (ref 3.5–5.2)
Calcium: 9.7 mg/dL (ref 8.4–10.5)
Sodium: 134 mEq/L — ABNORMAL LOW (ref 135–145)
Total Protein: 7.2 g/dL (ref 6.0–8.3)

## 2010-09-04 LAB — CBC
HCT: 35.3 % — ABNORMAL LOW (ref 39.0–52.0)
MCH: 31.4 pg (ref 26.0–34.0)
MCHC: 34.3 g/dL (ref 30.0–36.0)
RDW: 11.8 % (ref 11.5–15.5)

## 2010-09-04 LAB — LACTIC ACID, PLASMA: Lactic Acid, Venous: 1.1 mmol/L (ref 0.5–2.2)

## 2010-09-04 LAB — TROPONIN I: Troponin I: <0.3 ng/mL (ref <0.30)

## 2010-09-04 LAB — CK TOTAL AND CKMB (NOT AT ARMC)
CK, MB: 1 ng/mL (ref 0.3–4.0)
Relative Index: 0.9 (ref 0.0–2.5)
Relative Index: 0.8 (ref 0.0–2.5)

## 2010-09-05 DIAGNOSIS — I379 Nonrheumatic pulmonary valve disorder, unspecified: Secondary | ICD-10-CM

## 2010-09-05 LAB — HEPATIC FUNCTION PANEL
Alkaline Phosphatase: 53 U/L (ref 39–117)
Bilirubin, Direct: 0.1 mg/dL (ref 0.0–0.3)
Indirect Bilirubin: 0.3 mg/dL (ref 0.3–0.9)
Total Bilirubin: 0.4 mg/dL (ref 0.3–1.2)

## 2010-09-05 LAB — CBC
HCT: 32.9 % — ABNORMAL LOW (ref 39.0–52.0)
MCHC: 34.3 g/dL (ref 30.0–36.0)
Platelets: 243 10*3/uL (ref 150–400)
RDW: 11.8 % (ref 11.5–15.5)
WBC: 8.7 10*3/uL (ref 4.0–10.5)

## 2010-09-05 LAB — LIPID PANEL
Cholesterol: 109 mg/dL (ref 0–200)
HDL: 28 mg/dL — ABNORMAL LOW (ref >39)
Total CHOL/HDL Ratio: 3.9 RATIO
Triglycerides: 176 mg/dL — ABNORMAL HIGH (ref <150)
VLDL: 35 mg/dL (ref 0–40)

## 2010-09-05 LAB — T3: T3, Total: 138.6 ng/dl (ref 80.0–204.0)

## 2010-09-05 LAB — BASIC METABOLIC PANEL
Calcium: 8.8 mg/dL (ref 8.4–10.5)
GFR calc Af Amer: >60 mL/min (ref >60)
GFR calc non Af Amer: >60 mL/min (ref >60)
Glucose, Bld: 111 mg/dL — ABNORMAL HIGH (ref 70–99)
Potassium: 5.1 mEq/L (ref 3.5–5.1)
Sodium: 135 mEq/L (ref 135–145)

## 2010-09-05 LAB — HEMOGLOBIN A1C
Hgb A1c MFr Bld: 6.9 % — ABNORMAL HIGH (ref <5.7)
Mean Plasma Glucose: 151 mg/dL — ABNORMAL HIGH (ref <117)

## 2010-09-05 LAB — IRON AND TIBC
Iron: <10 ug/dL — ABNORMAL LOW (ref 42–135)
UIBC: 194 ug/dL

## 2010-09-05 LAB — PHOSPHORUS: Phosphorus: 3 mg/dL (ref 2.3–4.6)

## 2010-09-05 LAB — MAGNESIUM: Magnesium: 2.1 mg/dL (ref 1.5–2.5)

## 2010-09-05 LAB — FOLLICLE STIMULATING HORMONE: FSH: 2.9 m[IU]/mL (ref 1.4–18.1)

## 2010-09-05 LAB — FERRITIN: Ferritin: 307 ng/mL (ref 22–322)

## 2010-09-05 LAB — FOLATE: Folate: 11.5 ng/mL

## 2010-09-06 LAB — COMPREHENSIVE METABOLIC PANEL
ALT: 72 U/L — ABNORMAL HIGH (ref 0–53)
Alkaline Phosphatase: 51 U/L (ref 39–117)
BUN: 13 mg/dL (ref 6–23)
CO2: 25 mEq/L (ref 19–32)
Calcium: 9.1 mg/dL (ref 8.4–10.5)
GFR calc Af Amer: >60 mL/min (ref >60)
GFR calc non Af Amer: >60 mL/min (ref >60)
Glucose, Bld: 108 mg/dL — ABNORMAL HIGH (ref 70–99)
Sodium: 135 mEq/L (ref 135–145)
Total Protein: 5.9 g/dL — ABNORMAL LOW (ref 6.0–8.3)

## 2010-09-06 LAB — CBC
HCT: 29.9 % — ABNORMAL LOW (ref 39.0–52.0)
Hemoglobin: 10.4 g/dL — ABNORMAL LOW (ref 13.0–17.0)
MCH: 31.8 pg (ref 26.0–34.0)
MCHC: 34.8 g/dL (ref 30.0–36.0)
RBC: 3.27 MIL/uL — ABNORMAL LOW (ref 4.22–5.81)

## 2010-09-06 LAB — URINE CULTURE
Culture  Setup Time: 201206220113
Culture: NO GROWTH

## 2010-09-07 LAB — BASIC METABOLIC PANEL
Chloride: 101 mEq/L (ref 96–112)
GFR calc Af Amer: >60 mL/min (ref >60)
Potassium: 4.4 mEq/L (ref 3.5–5.1)
Sodium: 135 mEq/L (ref 135–145)

## 2010-09-07 LAB — CBC
HCT: 30.8 % — ABNORMAL LOW (ref 39.0–52.0)
Platelets: 248 10*3/uL (ref 150–400)
RDW: 11.7 % (ref 11.5–15.5)
WBC: 10.3 10*3/uL (ref 4.0–10.5)

## 2010-09-07 LAB — DIFFERENTIAL
Basophils Absolute: 0 10*3/uL (ref 0.0–0.1)
Basophils Relative: 0 % (ref 0–1)
Eosinophils Absolute: 0.3 10*3/uL (ref 0.0–0.7)
Eosinophils Relative: 3 % (ref 0–5)
Lymphocytes Relative: 29 % (ref 12–46)

## 2010-09-08 ENCOUNTER — Observation Stay (HOSPITAL_COMMUNITY): Payer: Medicaid Other

## 2010-09-08 LAB — ANA: Anti Nuclear Antibody(ANA): NEGATIVE

## 2010-09-09 ENCOUNTER — Observation Stay (HOSPITAL_COMMUNITY): Payer: Medicaid Other

## 2010-09-09 MED ORDER — SODIUM IODIDE I 131 CAPSULE
10.8000 | Freq: Once | INTRAVENOUS | Status: AC | PRN
  Administered 2010-09-09: 10.8 via ORAL

## 2010-09-09 MED ORDER — SODIUM PERTECHNETATE TC 99M INJECTION
10.0000 | Freq: Once | INTRAVENOUS | Status: AC | PRN
  Administered 2010-09-09: 10 via INTRAVENOUS

## 2010-09-10 ENCOUNTER — Telehealth: Payer: Self-pay | Admitting: *Deleted

## 2010-09-10 DIAGNOSIS — R748 Abnormal levels of other serum enzymes: Secondary | ICD-10-CM

## 2010-09-10 NOTE — Telephone Encounter (Signed)
Message copied by Freddi Starr on Wed Sep 10, 2010  1:38 PM ------      Message from: Wendall Stade      Created: Thu Sep 04, 2010  5:48 PM       Less fat in diet.  LDL only 50  ALT elevated  Repeat LFTs in 2 months

## 2010-09-10 NOTE — Telephone Encounter (Signed)
Spoke with pt, he is aware of lab results. He will have repeat labs in august Earl Williams

## 2010-10-01 ENCOUNTER — Other Ambulatory Visit (HOSPITAL_COMMUNITY): Payer: Medicaid Other | Admitting: Radiology

## 2010-10-01 ENCOUNTER — Ambulatory Visit: Payer: Medicaid Other | Admitting: Cardiovascular Disease

## 2010-10-01 NOTE — Discharge Summary (Signed)
NAMEEDMAR, BLANKENBURG NO.:  [id number]  MEDICAL RECORD NO.:  [id number]  LOCATION:  1520                         FACILITY:  [facility]  PHYSICIAN:  Ramiro Harvest, MD    DATE OF BIRTH:  [birth year]  DATE OF ADMISSION:  09/04/2010 DATE OF DISCHARGE:  09/09/2010                        DISCHARGE SUMMARY    PRIMARY CARE PHYSICIAN:  Fleet Contras, MD  ENDOCRINOLOGIST:  Reather Littler, MD  DISCHARGE DIAGNOSES: 1. Acute renal failure, resolved. 2. Hypotension, resolved. 3. Hyperthyroidism. 4. Severe iron-deficiency anemia. 5. Hypertension. 6. Hyperlipidemia. 7. Obstructive sleep apnea. 8. Obesity. 9. Hemophilia A.  DISCHARGE MEDICATIONS: 1. Over-the-counter blood supplement p.o. daily. 2. Coenzyme Q10 one tablet p.o. daily. 3. Lisinopril/hydrochlorothiazide 20/12.5 mg p.o. daily. 4. Niacin 525 mg p.o. daily. 5. Pravastatin 40 mg p.o. daily. 6. Vitamin B12 1000 mcg 1 tablet p.o. daily. 7. Vitamin D3 1000 units p.o. daily.  DISPOSITION AND FOLLOWUP: 1. The patient will be discharged home. 2. The patient is to follow up with PCP on September 16, 2010.  On followup,     a CBC and a BMET will need to be checked to follow up on the     patient's electrolytes and renal function.  Further evaluation of     his peripheral edema may be required per PCP as it was thought his     peripheral edema may likely be related to newly-diagnosed     hyperthyroidism.  The patient's Azor has been discontinued during     this hospitalization as it contains amlodipine which is a calcium     channel blocker and may cause peripheral edema.  PCP will need to     follow up on the patient's blood pressure.  Azor has been     discontinued; the patient is being discharged home on his     lisinopril and hydrochlorothiazide for blood pressure control and     will follow up with his PCP as an outpatient for further blood     pressure control. 3. The patient is also to follow up with Dr. Reather Littler  of     endocrinology 1 week post discharge.  At that time, the radioactive     iodine uptake study will be done then and will need to be reviewed     per endocrinologist.  Any further workup will need to be done per     endocrinology.  CONSULTATIONS:  An endocrine consultation was done.  The patient was seen in consultation by Dr. Reather Littler on September 07, 2010.  PROCEDURES PERFORMED: 1. A chest x-ray was done September 04, 2010, which showed no acute     cardiopulmonary findings. 2. A thyroid scan and uptake was done on September 09, 2010, and results     are pending at the time of this dictation. 3. A 2-D echo was done September 05, 2010, that showed a normal left     ventricular cavity size.  Systolic function was vigorous EF of 65%     to 70%.  BRIEF ADMISSION HISTORY AND PHYSICAL:  [name] is a 40- year-old African American gentleman with past medical history significant for hypertension, obstructive sleep apnea, obesity, hyperlipidemia, first-degree relative with diabetes and  peripheral edema, who presented to the ED with complaints of generalized weakness and lightheadedness on September 03, 2010.  The patient reported that he was recently instructed to double his dose of diuretic in order to see if this increased dose will help with his peripheral edema.  After he started taking this medication, the patient started to feel weak and exhausted.  In the ED, he was found to have an elevated BUN and acute renal failure with a creatinine of 2.77, and also found to be hypotensive.  The patient was advised to stay in the hospital for further evaluation and treatment, fluid resuscitation, and to follow his kidney function as well.  However, the patient signed out AMA from the ED on the 20th and returned on the day of admission on the 21st.  At that moment, the patient continued to feel exhausted and also weak, blood pressure was a little bit better than it was the day prior to admission,  was still borderline low.  Hospitalists were called to admit the patient for further evaluation and management.  For the rest of admission history and physical, please see H and P dictated by Dr. Gwenlyn Perking of job number [id number].  HOSPITAL COURSE: 1. Acute renal failure:  The patient was admitted with acute renal     failure.  It was felt to be likely secondary to prerenal azotemia     secondary to aggressive diuresis as his Lasix dose was doubled and     he was also on hydrochlorothiazide.  The patient's diuretics were     held throughout the hospitalization.  The patient was hydrated with     IV fluids.  He was monitored and followed.  His blood pressure     improved during the hospitalization.  His renal function also     improved and had resolved by day of discharge.  His creatinine was     1.11 prior to discharge.  The patient was discharged in stable and     improved condition.  He will be resumed back on his     hydrochlorothiazide.  His Lasix has been discontinued at this time.     He will need to follow up with his PCP.  On followup, the patient     will need a BMET done to follow up on his electrolytes and renal     function. 2. Hypotension:  The patient was admitted, found to be borderline     hypotensive.  It was felt this was secondary to increased use of     diuretics; these were held.  The patient was placed on fluid     resuscitation, was monitored and followed.  He was subsequently     euvolemic.  His Lasix has been discontinued.  His blood pressure     has remained stable during the hospitalization.  He will be resumed     back on his home regimen of hydrochlorothiazide on discharge.  He     will follow up with his PCP as an outpatient. 3. Hyperthyroidism:  The patient was admitted.  The patient did have     some complaints of peripheral edema and workup was done for it.     His LFTs were unremarkable.  The patient did not have any     significant proteinuria.  2-D echo  which was done was unremarkable.     TSH which was obtained did come back decreased at less than 0.008.     Subsequently a  free T4 was obtained which came back elevated at     2.15; free T3 which was obtained also came back elevated at 5.6; LH     which was obtained was 3.5 and FSH which was obtained was 2.9; a     total T3 which was obtained came back at 138.6.  Prolactin levels     which were also obtained came back at 6.3.  It was felt that the     patient was likely hyperthyroid.  It was felt this might be     contributing to his peripheral edema and as such an endocrinology     consultation was obtained.  The patient was seen in consultation by     Dr. Lucianne Muss on September 07, 2010, at which time it was recommended to get     the thyroid uptake scan, which was ordered and was pending at the     time of discharge.  It was felt that the patient will follow up     with Dr. Kumar 1 week post discharge to follow up on these results     and further evaluation and workup of his hyperthyroidism.  The     patient will be discharged home in stable condition. 4. Severe iron-deficiency anemia:  The patient during the     hospitalization was noted to be anemic.  An anemia panel which was     subsequently obtained came back with iron level of less than 10,     B12 levels of 1617, a folate level of 11.5, with a ferritin of 307.     The patient's H and H remained stable during this hospitalization     and his discharge hemoglobin was 10.8.  The patient is being     followed at [facility] by a hematologist.  He needs to follow     up as an outpatient with them for further evaluation and management     of his severe iron-deficiency anemia. 5. Peripheral edema:  During the hospitalization, the patient had     complaints of peripheral edema which he stated had started recently     over the past couple of weeks prior to admission and it was also     stated per patient that the peripheral edema was on the  right lower     extremity greater than the left.  The patient denied being on any     calcium channel blockers.  The patient did state that he had a     recent Dopplers of the lower extremities done which were negative.     He stated that he had an MRI pending of the lower extremity per his     hematologist.  His hepatic panel which was obtained was     unremarkable.  His urinalysis did not have any significant     proteinuria.  2-D echo which was obtained showed a normal EF.  The     patient was not in overt heart failure.  TSH which was obtained was     very decreased at less than 0.008.  Subsequently it was felt the     patient was hyperthyroid; it was felt this might be contributing to     his peripheral edema and endocrinology consultation was obtained.     The patient was seen in consultation by Dr. Lucianne Muss on September 07, 2010     and a thyroid uptake scan was obtained with the results pending on  September 09, 2010, which will be followed up per Dr. Lucianne Muss.  The     patient was monitored and followed and remained in stable     condition.  During this dictation, I realized that the patient     indeed was on a calcium channel blocker of amlodipine which is a     combination medication with his Azor.  As such, we will discontinue     his amlodipine and he will need to follow up with his PCP as an     outpatient for follow up on his peripheral edema as well as follow     up with Dr. Lucianne Muss for his presumed hyperthyroidism.  The rest of     the patient's chronic medical issues have remained stable     throughout the hospitalization and the patient will be discharged     in stable and improved condition.  During the hospitalization, the     patient's antihypertensive medications were held and these will be     resumed on discharge.  On day of discharge, vital signs:  Temperature 98.4, pulse of 84, blood pressure 135/80, respirations of 20, saturating 96% on room air.  It has been a pleasure  taking care of [name].     Ramiro Harvest, MD     DT/MEDQ  D:  09/09/2010  T:  09/09/2010  Job:  [id number]  cc:   Fleet Contras, M.D. Fax: [phone]  Reather Littler, M.D. Fax: [phone]  Electronically Signed by Ramiro Harvest MD on 10/01/2010 06:26:56 PM

## 2010-10-03 NOTE — Consult Note (Signed)
Earl Williams, Earl Williams         ACCOUNT NO.:  [id number]  MEDICAL RECORD NO.:  [id number]  LOCATION:                                 FACILITY:  PHYSICIAN:  Reather Littler, M.D.       DATE OF BIRTH:  08/06/1970  DATE OF CONSULTATION: DATE OF DISCHARGE:                                CONSULTATION   REASON FOR CONSULTATION:  Hyperthyroidism.  HISTORY:  This is a 40 year old African American patient who was admitted to the hospital with dehydration and acute renal failure as well as hypotension.  Apparently, the patient has had swelling of his feet and legs for the last year or so, especially on the right side. The patient has been on diuretics for this and these were recently increased.  The patient started feeling exhausted and lightheaded on the day before admission and also felt his heart racing.  The patient was found to have relatively low blood pressure and increased creatinine of 2.8 when he was admitted.  The patient also had a relatively fast heart rate and not clear if his thyroid was checked because of tachycardia or simply general screen.  His initial TSH 0.01 and then subsequently was confirmed to be low again at 0.08.  The patient also had a high free T4 of 2.15 and free T3 of 5.6.  On questioning, the patient says that he has been feeling more tired over the last year, especially for the last month.  He also has had decreased motivation to do thinks.  He has no difficulty with muscle weakness and no difficulty walking upstairs or any physical activity. He has not lost any weight.  His appetite is unchanged.  He does not feel unusually hot and sweaty except the day prior to admission and since then he has been feeling hot and cold alternatively.  The patient says for the last year he has been feeling relatively shaky in his hands and feels jittery also.  He has not been told why he has had leg edema but apparently has been taking Lasix for this along with  his hypertensive medication his HCTZ.  Since his admission, he has not been on any antihypertensive and his blood pressure is normal.  PAST MEDICAL HISTORY:  No significant past medical illness.  FAMILY HISTORY:  Positive for diabetes, CAD, hypertension, and hyperlipidemia.  No history of thyroid disease.  PERSONAL HISTORY:  He does not smoke.  He has been single.  ALLERGIES:  None.  HOME MEDICATIONS:  TriCor, Pravachol, HCTZ, and Lasix.  REVIEW OF SYSTEMS:  The patient has not had any diabetes.  He has not had any unusual headaches or eye problems.  He has not had any difficulty swallowing or pain in his neck area.  He said that [facility], he had a little difficulty breathing for the day but otherwise no shortness of breath.  He has been treated for sleep apnea.  He also has had mild hypertension, hyperlipidemia  and he says that he has had difficulty losing weight, has not had any GI symptoms like diarrhea or constipation.  His leg edema has been problem for the last year as above.  PHYSICAL EXAMINATION:  GENERAL:  The patient is alert, pleasant and cooperative. VITAL SIGNS:  His pulse is 74, blood pressure 135/75, temperature normal.  He weighs 108 kg. HEENT:  He has no swelling of his eyes.  He has mild stare and lid lag but no proptosis.  Oral mucosa is normal. NECK:  Thyroid is just palpable on the right side and soft, this is somewhat indistinct and not clear if he has a significant thyroid enlargement.  There is no lymphadenopathy in his neck. HEART:  Heart sounds are normal. LUNGS:  Clear. ABDOMEN:  No hepatosplenomegaly or other masses. EXTREMITIES:  He has no rash.  No cyanosis or clubbing.  He has got mild pedal edema bilaterally but no leg edema.  His reflexes are absent throughout.  There is no tremor present.  ASSESSMENT:  The patient's lab work is indicative of hypothyroidism. Since he does not have any symptoms suggestive of  subacute thyroiditis, we will assume that he may have mild Graves disease, however, he has only minimal symptoms.  We will need to confirm his diagnosis with I-131 uptake and scan and plan on treatment thereafter.  This can be arranged as an outpatient tomorrow.  The patient was given options for treatment for hypothyroidism if it is Graves disease and he is not very receptive to the idea of  thyroid ablation.  He may well get by with antithyroid drugs since he has relatively mild hypothyroidism but also emphasized to him the need for regular followup and monitoring of his thyroid and other lab parameters if he is treated with antithyroid drugs.     Reather Littler, M.D.     AK/MEDQ  D:  09/07/2010  T:  09/07/2010  Job:  [id number]  cc:   Ramiro Harvest, MD  Dr. Manson Passey at [facility]  Electronically Signed by Reather Littler M.D. on 10/03/2010 02:52:56 PM

## 2010-10-03 NOTE — H&P (Signed)
Earl Williams, WALTHER         ACCOUNT NO.:  [id number]  MEDICAL RECORD NO.:  [id number]  LOCATION:  1520                         FACILITY:  [facility]  PHYSICIAN:  Rosanna Randy, MDDATE OF BIRTH:  [birth year]  DATE OF ADMISSION:  09/04/2010 DATE OF DISCHARGE:                             HISTORY & PHYSICAL   PRIMARY CARE PHYSICIAN:  The patient's primary care physician is Dr. Manson Passey, [facility].  CHIEF COMPLAINT:  Generalized weakness, lightheadedness, feeling exhausted.  HISTORY OF PRESENT ILLNESS:  Earl Williams is a 40 year old male with past medical history significant for hypertension, sleep apnea, obesity, hyperlipidemia, and a first-degree relative with diabetes and peripheral edema who came to the emergency department complaining of generalized weakness and lightheadedness on September 03, 2010.  The patient reports that he was recently instructed to double the dose of his diuretic in order to see if this increased dose help with his peripheral edema.  After he started taking this medication, the patient started feeling weak and exhausted and in the emergency department he was found to have elevated BUN, acute renal failure with a creatinine up to 2.77 and also to be hypotensive.  The patient was advised to stay in the hospital for further evaluation and treatment, fluid resuscitation and to follow his kidney function but due to personal reason, he signed out AMA from the emergency department on the 20th and returned today on the 21st.  At this moment, the patient continues feeling exhausted and also weak and his blood pressure even is better than what it was yesterday, is still borderline low.  [facility] was called to admit the patient and provide further evaluation and treatment.  ALLERGIES:  There is no known drug allergy.  PAST MEDICAL HISTORY:  Significant for hypertension, hyperlipidemia, first-degree relative with diabetes, sleep apnea, obesity,  hemophilia A.  MEDICATIONS:  Medication reconciliation form has not been done at the moment of this dictation but looking into his recent discharge summaries and also most recent visit to Dr. Maple Hudson, pulmonologist, the patient a supposed to be taking hydrochlorothiazide 25 mg 1 tablet by mouth daily, TriCor 48 mg 1 tablet by mouth daily, Lasix 20 mg 1 tablet by mouth once a day and recently increased to 2 tablets by mouth daily, Pravachol 40 mg 1 tablet by mouth daily.  He also uses p.r.n. armodafinil 150 mg tablet for alertness due to his sleep apnea.  FAMILY HISTORY:  Significant for diabetes, coronary artery disease, hypertension, hyperlipidemia.  SOCIAL HISTORY:  He lives at home with his mother.  Denies any tobacco, alcohol or illicit drugs consumption.  REVIEW OF SYSTEMS:  With a couple of bruises where the blood pressure cuff was located especially on his right arm and also edema on his lower extremity bilaterally, 1 to 2+, otherwise negative except as mentioned on HPI.  PHYSICAL EXAMINATION:  VITAL SIGNS:  The patient's physical exam demonstrated vital signs with temperature of 99.0, heart rate of 94, respiratory rate 16, blood pressure 112/59, oxygen saturation 95% on room air, respiratory rate was 16 and unlabored. GENERAL:  In general, the patient was in no acute distress, lying in bed, a little bit frustrated that I will be asking him the same questions that  he already answered but after the conversation started and the whole dynamic, he was cooperative and he was actually pretty pleasant throughout the whole interview. HEENT:  Normocephalic.  No trauma.  Eyes:  Extraocular muscles intact. No icterus, no nystagmus.  PERRLA.  There was no ear or nose discharges. Fair dentition.  No erythema or exudate inside his mouth and after receiving almost a whole 1.2 L while in the emergency department, he was no longer having severe dryness of his mucous membranes and he was  not having orthostatic changes. NECK:  Supple.  No thyromegaly.  No bruits. RESPIRATORY SYSTEM:  Clear to auscultation bilaterally.  No rhonchi.  No wheezing. HEART:  Regular rate and rhythm.  No murmurs, gallops or rubs. ABDOMEN:  Soft, nontender, nondistended.  Positive bowel sounds.  He was obese. EXTREMITIES:  No cyanosis.  Positive edema bilaterally, 1 to 2+.  There are some discolorations and dermatitis on his skin which appear to be old and healing at this moment. NEUROLOGIC EXAM:  The patient was alert, awake and oriented x3.  Muscle strength 5/5 bilaterally symmetrically.  Cranial nerves II through XII intact.  LABORATORY DATA:  The patient had a urinalysis which was negative with urine microscopy just showing many bacteria and 0 to 2 white blood cells.  Cardiac markers negative x2 in the emergency department.  CBC with differential with white blood cells of 9.6, hemoglobin 12.1, platelets 256, lactic acid of 1.1.  BMET showing a sodium of 135, potassium 4.0, chloride 95, bicarb 28, glucose 105, BUN 40, creatinine 1.79, calcium 9.7.  The patient had a chest x-ray that demonstrated no acute cardiopulmonary findings.  ASSESSMENT AND PLAN: 1. Acute renal failure in this patient most likely secondary to     combination of aggressive diuresis and hypotension since he was     recently having double doses of his Lasix and he is also using     HCTZ.  At this moment we are going to hold off on the HCTZ and the     Lasix.  We are going to provide fluid resuscitation.  We are going     to follow his creatinine and in case renal function fails to     respond with fluid resuscitation, then we will go ahead and order     renal ultrasound and if required a renal consultation.  The main     goal is to try to keep her blood pressure above 95 systolic.     Looking back into the ED records, the patient had systolic blood     pressure as low as 80s on September 03, 2010. 2. Hypotension secondary  to the use of antihypertensive drugs.  We are     going to hold on these medications.  We are going to provide fluid     resuscitation.  We are going to do orthostatic vital signs.  We are     going to follow his vital signs closely.  Medications to be     restarted slowly once his renal function and vital signs are     stable. 3. Obesity.  We have recommended a low-calorie diet and we have     ordered diet counseling while he is in the hospital due to his     obesity. 4. Hyperlipidemia.  We are going to check a fasting lipid profile.     Depending on the levels, we are going to determine if he needs or     not  to be started on statins.  Currently he is not taking any     medication for that. 5. Hypertension with ongoing hypotension.  At this moment we are going     to hold off on his antihypertensive regimen and we are going to     just follow his vital signs and provide fluid resuscitation. 6. First-degree relatives with diabetes.  We are going to check a     hemoglobin A1c and also fasting glucose.  The patient has blood     sugar that was mildly elevated at 105 this morning. 7. Sleep apnea.  We are going to continue using CPAP per RT with CWP     of 19  which is what he is supposed to be using at home. 8. Anemia, most likely is secondary to anemia of chronic disease.     Hemoglobin 12.1 with MCV of 91.7.  There are no signs of bleeding.     We will go ahead and check an anemia panel and following the     results determine what further treatment or workup is needed. 9. Gastroesophageal reflux disease.  We are going to use Protonix 40     mg by mouth daily. 10.Deep vein thrombosis  prophylaxis.  We are going to use SCDs due to     the history of hemophilia A.  Further workup and treatment depending evolution and results of the studies that have been ordered.  The patient is going to be followed by Team [facility] [employer] [facility].     Rosanna Randy,  MD     CEM/MEDQ  D:  09/04/2010  T:  09/04/2010  Job:  [id number]  cc:   [facility]  Electronically Signed by Vassie Loll MD on 10/03/2010 04:25:27 PM

## 2010-10-13 ENCOUNTER — Other Ambulatory Visit (HOSPITAL_COMMUNITY): Payer: Medicaid Other | Admitting: Radiology

## 2010-10-13 ENCOUNTER — Encounter: Payer: Medicaid Other | Admitting: Cardiovascular Disease

## 2010-10-21 ENCOUNTER — Encounter: Payer: Self-pay | Admitting: Cardiovascular Disease

## 2010-11-10 ENCOUNTER — Other Ambulatory Visit: Payer: Medicaid Other | Admitting: *Deleted

## 2010-12-05 LAB — INFLUENZA A AND B ANTIGEN (CONVERTED LAB): Inflenza A Ag: NEGATIVE

## 2010-12-29 LAB — POCT URINALYSIS DIP (DEVICE)
Nitrite: NEGATIVE
Specific Gravity, Urine: >=1.03
Urobilinogen, UA: 0.2
pH: 5

## 2010-12-29 LAB — URINE CULTURE: Colony Count: 1000

## 2011-03-11 ENCOUNTER — Emergency Department (HOSPITAL_COMMUNITY)
Admission: EM | Admit: 2011-03-11 | Discharge: 2011-03-11 | Disposition: A | Discharge status: Home / Self Care | Payer: Medicaid Other | Attending: Emergency Medicine | Admitting: Emergency Medicine

## 2011-03-11 ENCOUNTER — Emergency Department (HOSPITAL_COMMUNITY): Payer: Medicaid Other

## 2011-03-11 ENCOUNTER — Encounter (HOSPITAL_COMMUNITY): Payer: Self-pay | Admitting: Emergency Medicine

## 2011-03-11 DIAGNOSIS — J111 Influenza due to unidentified influenza virus with other respiratory manifestations: Secondary | ICD-10-CM | POA: Insufficient documentation

## 2011-03-11 DIAGNOSIS — R0602 Shortness of breath: Secondary | ICD-10-CM | POA: Insufficient documentation

## 2011-03-11 DIAGNOSIS — K5900 Constipation, unspecified: Secondary | ICD-10-CM | POA: Insufficient documentation

## 2011-03-11 MED ORDER — POLYETHYLENE GLYCOL 3350 17 GM/SCOOP PO POWD: 17.0000 g | Freq: Every day | ORAL | Status: AC

## 2011-03-11 MED ORDER — DOCUSATE SODIUM 100 MG PO CAPS: 100.0000 mg | ORAL_CAPSULE | Freq: Two times a day (BID) | ORAL | Status: AC

## 2011-03-11 MED ORDER — BENZONATATE 100 MG PO CAPS: 100.0000 mg | ORAL_CAPSULE | Freq: Three times a day (TID) | ORAL | Status: AC

## 2011-03-11 NOTE — ED Provider Notes (Signed)
History     CSN: [id number]  Arrival date & time 03/11/11  1402   First MD Initiated Contact with Patient 03/11/11 1622      Chief Complaint  Patient presents with  . Fever  . Cough  . Generalized Body Aches    (Consider location/radiation/quality/duration/timing/severity/associated sxs/prior treatment) HPI Comments: Daughter has had similar symptoms.  Patient is a 40 y.o. male presenting with fever and cough. The history is provided by the patient.  Fever Primary symptoms of the febrile illness include fever, fatigue, headaches, cough and myalgias. Primary symptoms do not include visual change, wheezing, shortness of breath, nausea, vomiting, diarrhea, dysuria or rash. Episode onset: One week.  The headache is not associated with visual change or neck stiffness.   Cough Associated symptoms include chills, headaches, sore throat and myalgias. Pertinent negatives include no chest pain, no ear pain, no shortness of breath and no wheezing.    Past Medical History  Diagnosis Date  . HLD (hyperlipidemia)   . HTN (hypertension)   . Hemophilia   . OSA on CPAP   . Dyspnea     Past Surgical History  Procedure Date  . Tonsillectomy     No family history on file.  History  Substance Use Topics  . Smoking status: Never Smoker   . Smokeless tobacco: Not on file  . Alcohol Use: Not on file      Review of Systems  Constitutional: Positive for fever, chills, appetite change and fatigue.  HENT: Positive for congestion and sore throat. Negative for ear pain, drooling, trouble swallowing, neck pain, neck stiffness, voice change and sinus pressure.   Respiratory: Positive for cough. Negative for chest tightness, shortness of breath and wheezing.   Cardiovascular: Negative for chest pain.  Gastrointestinal: Positive for constipation. Negative for nausea, vomiting and diarrhea.  Genitourinary: Negative for dysuria.  Musculoskeletal: Positive for myalgias.  Skin: Negative for  rash.  Neurological: Positive for headaches. Negative for dizziness, syncope and light-headedness.  Psychiatric/Behavioral: Negative for confusion.    Allergies  Aspirin and Dilaudid  Home Medications   Current Outpatient Rx  Name Route Sig Dispense Refill  . IBUPROFEN 200 MG PO TABS Oral Take 400 mg by mouth every 6 (six) hours as needed. For pain.     Marland Kitchen PSEUDOEPHEDRINE-GUAIFENESIN 60-600 MG PO TB12 Oral Take 2 tablets by mouth every 12 (twelve) hours as needed. For cold symptoms.       BP 138/98  Pulse 89  Temp(Src) 98.8 F (37.1 C) (Oral)  Resp 22  SpO2 99%  Physical Exam  Nursing note and vitals reviewed. Constitutional: He is oriented to person, place, and time. He appears well-developed and well-nourished. No distress.  HENT:  Head: Normocephalic and atraumatic.  Right Ear: Tympanic membrane and ear canal normal.  Left Ear: Tympanic membrane and ear canal normal.  Nose: Mucosal edema present.  Mouth/Throat: Uvula is midline, oropharynx is clear and moist and mucous membranes are normal.  Neck: Normal range of motion. Neck supple.  Cardiovascular: Normal rate, regular rhythm and normal heart sounds.   Pulmonary/Chest: Effort normal and breath sounds normal. No respiratory distress. He has no wheezes.  Abdominal: Soft. Bowel sounds are normal. He exhibits no distension and no mass. There is no tenderness. There is no rebound and no guarding.  Musculoskeletal: Normal range of motion.  Neurological: He is alert and oriented to person, place, and time.  Skin: Skin is warm and dry. No rash noted. He is not diaphoretic.  Psychiatric: He  has a normal mood and affect.    ED Course  Procedures (including critical care time)  Labs Reviewed - No data to display Dg Chest 2 View  03/11/2011  *RADIOLOGY REPORT*  Clinical Data: cough and SOB  CHEST - 2 VIEW  Comparison: 09/04/2010  Findings: The heart size and mediastinal contours are within normal limits.  Both lungs are clear.   The visualized skeletal structures are unremarkable.  IMPRESSION: No active disease.  Original Report Authenticated By: Rosealee Albee, M.D.     1. Influenza   2. Constipation       MDM  Signs and symptoms consistent with Influenza.  Negative CXR.  Symptoms have been present for 1 week.  Therefore, tamiflu was not prescribed.        Pascal Lux [facility] 03/14/11 1955

## 2011-03-11 NOTE — ED Notes (Signed)
Father states that daughter has been sick, cough, sore throat not feeling well, states that he feels sob when breathing sats 99% ra,

## 2011-03-15 ENCOUNTER — Telehealth: Payer: Self-pay | Admitting: Internal Medicine

## 2011-03-15 ENCOUNTER — Encounter (HOSPITAL_COMMUNITY): Payer: Self-pay | Admitting: *Deleted

## 2011-03-15 ENCOUNTER — Other Ambulatory Visit: Payer: Self-pay

## 2011-03-15 ENCOUNTER — Emergency Department (HOSPITAL_COMMUNITY): Payer: Medicaid Other

## 2011-03-15 ENCOUNTER — Inpatient Hospital Stay (HOSPITAL_COMMUNITY)
Admission: EM | Admit: 2011-03-15 | Discharge: 2011-03-18 | DRG: 193 | Disposition: A | Discharge status: Home / Self Care | Payer: Medicaid Other | Attending: Internal Medicine | Admitting: Internal Medicine

## 2011-03-15 DIAGNOSIS — I119 Hypertensive heart disease without heart failure: Secondary | ICD-10-CM | POA: Insufficient documentation

## 2011-03-15 DIAGNOSIS — J189 Pneumonia, unspecified organism: Principal | ICD-10-CM | POA: Diagnosis present

## 2011-03-15 DIAGNOSIS — I10 Essential (primary) hypertension: Secondary | ICD-10-CM | POA: Diagnosis present

## 2011-03-15 DIAGNOSIS — D66 Hereditary factor VIII deficiency: Secondary | ICD-10-CM | POA: Diagnosis present

## 2011-03-15 DIAGNOSIS — E785 Hyperlipidemia, unspecified: Secondary | ICD-10-CM | POA: Diagnosis present

## 2011-03-15 DIAGNOSIS — E0590 Thyrotoxicosis, unspecified without thyrotoxic crisis or storm: Secondary | ICD-10-CM | POA: Diagnosis present

## 2011-03-15 DIAGNOSIS — G4733 Obstructive sleep apnea (adult) (pediatric): Secondary | ICD-10-CM | POA: Diagnosis present

## 2011-03-15 LAB — CBC
HCT: 43.3 % (ref 39.0–52.0)
MCH: 30.9 pg (ref 26.0–34.0)
MCH: 31.4 pg (ref 26.0–34.0)
MCHC: 35.3 g/dL (ref 30.0–36.0)
MCV: 87.5 fL (ref 78.0–100.0)
MCV: 88 fL (ref 78.0–100.0)
Platelets: 200 10*3/uL (ref 150–400)
Platelets: 178 10*3/uL (ref 150–400)
RDW: 13.1 % (ref 11.5–15.5)
RDW: 13.1 % (ref 11.5–15.5)
WBC: 12.9 10*3/uL — ABNORMAL HIGH (ref 4.0–10.5)

## 2011-03-15 LAB — POCT I-STAT, CHEM 8
BUN: 10 mg/dL (ref 6–23)
Calcium, Ion: 1.11 mmol/L — ABNORMAL LOW (ref 1.12–1.32)
Hemoglobin: 16 g/dL (ref 13.0–17.0)
TCO2: 25 mmol/L (ref 0–100)

## 2011-03-15 LAB — COMPREHENSIVE METABOLIC PANEL
AST: 28 U/L (ref 0–37)
Albumin: 3.7 g/dL (ref 3.5–5.2)
CO2: 27 mEq/L (ref 19–32)
Calcium: 9.5 mg/dL (ref 8.4–10.5)
Creatinine, Ser: 1.17 mg/dL (ref 0.50–1.35)
GFR calc non Af Amer: 76 mL/min — ABNORMAL LOW (ref >90)

## 2011-03-15 LAB — DIFFERENTIAL
Basophils Absolute: 0 10*3/uL (ref 0.0–0.1)
Basophils Absolute: 0 10*3/uL (ref 0.0–0.1)
Eosinophils Absolute: 0 10*3/uL (ref 0.0–0.7)
Eosinophils Relative: 0 % (ref 0–5)
Lymphocytes Relative: 8 % — ABNORMAL LOW (ref 12–46)
Lymphocytes Relative: 6 % — ABNORMAL LOW (ref 12–46)
Monocytes Absolute: 0.4 10*3/uL (ref 0.1–1.0)
Monocytes Absolute: 0.2 10*3/uL (ref 0.1–1.0)
Neutro Abs: 8.9 10*3/uL — ABNORMAL HIGH (ref 1.7–7.7)

## 2011-03-15 MED ORDER — ALBUTEROL SULFATE (5 MG/ML) 0.5% IN NEBU: 2.5000 mg | INHALATION_SOLUTION | RESPIRATORY_TRACT | Status: DC | PRN

## 2011-03-15 MED ORDER — ACETAMINOPHEN 650 MG RE SUPP: 650.0000 mg | Freq: Four times a day (QID) | RECTAL | Status: DC | PRN

## 2011-03-15 MED ORDER — ONDANSETRON HCL 4 MG/2ML IJ SOLN: 4.0000 mg | Freq: Four times a day (QID) | INTRAMUSCULAR | Status: DC | PRN

## 2011-03-15 MED ORDER — MORPHINE SULFATE 2 MG/ML IJ SOLN: 2.0000 mg | INTRAMUSCULAR | Status: DC | PRN

## 2011-03-15 MED ORDER — ALBUTEROL SULFATE (5 MG/ML) 0.5% IN NEBU
5.0000 mg | INHALATION_SOLUTION | RESPIRATORY_TRACT | Status: DC
  Administered 2011-03-15: 5 mg via RESPIRATORY_TRACT
  Filled 2011-03-15 (×2): qty 0.5
  Filled 2011-03-15: qty 1

## 2011-03-15 MED ORDER — DEXTROSE 5 % IV SOLN
500.0000 mg | INTRAVENOUS | Status: DC
  Administered 2011-03-15 – 2011-03-16 (×3): 500 mg via INTRAVENOUS
  Filled 2011-03-15 (×4): qty 500

## 2011-03-15 MED ORDER — ACETAMINOPHEN 325 MG PO TABS: 650.0000 mg | ORAL_TABLET | Freq: Four times a day (QID) | ORAL | Status: DC | PRN

## 2011-03-15 MED ORDER — ALBUTEROL SULFATE (5 MG/ML) 0.5% IN NEBU
2.5000 mg | INHALATION_SOLUTION | Freq: Four times a day (QID) | RESPIRATORY_TRACT | Status: DC
  Administered 2011-03-15 – 2011-03-17 (×7): 2.5 mg via RESPIRATORY_TRACT
  Filled 2011-03-15 (×6): qty 0.5

## 2011-03-15 MED ORDER — SODIUM CHLORIDE 0.9 % IV SOLN
INTRAVENOUS | Status: DC
  Administered 2011-03-15: 20 mL/h via INTRAVENOUS

## 2011-03-15 MED ORDER — DOCUSATE SODIUM 100 MG PO CAPS
100.0000 mg | ORAL_CAPSULE | Freq: Two times a day (BID) | ORAL | Status: DC
  Administered 2011-03-15 – 2011-03-18 (×6): 100 mg via ORAL
  Filled 2011-03-15 (×7): qty 1

## 2011-03-15 MED ORDER — BIOTENE DRY MOUTH MT LIQD
15.0000 mL | Freq: Two times a day (BID) | OROMUCOSAL | Status: DC
  Administered 2011-03-15 – 2011-03-17 (×5): 15 mL via OROMUCOSAL

## 2011-03-15 MED ORDER — ALBUTEROL SULFATE (5 MG/ML) 0.5% IN NEBU
5.0000 mg | INHALATION_SOLUTION | Freq: Once | RESPIRATORY_TRACT | Status: AC
  Administered 2011-03-15: 5 mg via RESPIRATORY_TRACT
  Filled 2011-03-15: qty 1

## 2011-03-15 MED ORDER — AZITHROMYCIN 250 MG PO TABS
500.0000 mg | ORAL_TABLET | Freq: Once | ORAL | Status: AC
  Administered 2011-03-15: 500 mg via ORAL
  Filled 2011-03-15: qty 2

## 2011-03-15 MED ORDER — PREDNISONE 20 MG PO TABS
60.0000 mg | ORAL_TABLET | Freq: Once | ORAL | Status: AC
  Administered 2011-03-15: 60 mg via ORAL
  Filled 2011-03-15: qty 3

## 2011-03-15 MED ORDER — DEXTROSE 5 % IV SOLN
1.0000 g | INTRAVENOUS | Status: DC
  Administered 2011-03-15: 1 g via INTRAVENOUS
  Filled 2011-03-15 (×2): qty 10

## 2011-03-15 MED ORDER — IPRATROPIUM BROMIDE 0.02 % IN SOLN
0.5000 mg | Freq: Once | RESPIRATORY_TRACT | Status: AC
  Administered 2011-03-15: 0.5 mg via RESPIRATORY_TRACT
  Filled 2011-03-15: qty 2.5

## 2011-03-15 MED ORDER — ENOXAPARIN SODIUM 40 MG/0.4ML ~~LOC~~ SOLN
40.0000 mg | SUBCUTANEOUS | Status: DC
  Filled 2011-03-15 (×2): qty 0.4

## 2011-03-15 MED ORDER — ALUM & MAG HYDROXIDE-SIMETH 200-200-20 MG/5ML PO SUSP: 30.0000 mL | Freq: Four times a day (QID) | ORAL | Status: DC | PRN

## 2011-03-15 MED ORDER — SODIUM CHLORIDE 0.9 % IV SOLN
Freq: Once | INTRAVENOUS | Status: AC
Start: 2011-03-15 — End: 2011-03-15
  Administered 2011-03-15: 20 mL/h via INTRAVENOUS

## 2011-03-15 MED ORDER — METHIMAZOLE 10 MG PO TABS
10.0000 mg | ORAL_TABLET | Freq: Every day | ORAL | Status: DC
  Administered 2011-03-15 – 2011-03-18 (×4): 10 mg via ORAL
  Filled 2011-03-15 (×4): qty 1

## 2011-03-15 MED ORDER — GUAIFENESIN ER 600 MG PO TB12
600.0000 mg | ORAL_TABLET | Freq: Two times a day (BID) | ORAL | Status: DC
  Administered 2011-03-15 – 2011-03-18 (×6): 600 mg via ORAL
  Filled 2011-03-15 (×7): qty 1

## 2011-03-15 MED ORDER — GUAIFENESIN-DM 100-10 MG/5ML PO SYRP: 5.0000 mL | ORAL_SOLUTION | ORAL | Status: DC | PRN

## 2011-03-15 MED ORDER — ONDANSETRON HCL 4 MG PO TABS: 4.0000 mg | ORAL_TABLET | Freq: Four times a day (QID) | ORAL | Status: DC | PRN

## 2011-03-15 MED ORDER — DEXTROSE 5 % IV SOLN
1.0000 g | INTRAVENOUS | Status: DC
  Administered 2011-03-16: 1 g via INTRAVENOUS
  Filled 2011-03-15 (×2): qty 10

## 2011-03-15 MED ORDER — ONDANSETRON HCL 4 MG/2ML IJ SOLN
4.0000 mg | Freq: Once | INTRAMUSCULAR | Status: AC
  Administered 2011-03-15: 4 mg via INTRAVENOUS
  Filled 2011-03-15: qty 2

## 2011-03-15 NOTE — Progress Notes (Signed)
ANTIBIOTIC CONSULT NOTE - INITIAL  Pharmacy Consult for Rocephin Indication:  PNA   Allergies  Allergen Reactions  . Aspirin   . Dilaudid (Hydromorphone Hcl)     Stomach upset.    Patient Measurements:   Adjusted Body Weight:   Vital Signs: Temp: 98.7 F (37.1 C) (12/30 1137) Temp src: Oral (12/30 1137) BP: 130/75 mmHg (12/30 1306) Pulse Rate: 80  (12/30 1306) Intake/Output from previous day:   Intake/Output from this shift: Total I/O In: 240 [P.O.:240] Out: -   Labs:  Basename 03/15/11 1041 03/15/11 0930  WBC -- 12.9*  HGB 16.0 15.3  PLT -- 200  LABCREA -- --  CREATININE 1.20 --   The CrCl is unknown because both a height and weight (above a minimum accepted value) are required for this calculation. No results found for this basename: VANCOTROUGH:2,VANCOPEAK:2,VANCORANDOM:2,GENTTROUGH:2,GENTPEAK:2,GENTRANDOM:2,TOBRATROUGH:2,TOBRAPEAK:2,TOBRARND:2,AMIKACINPEAK:2,AMIKACINTROU:2,AMIKACIN:2, in the last 72 hours   Microbiology: No results found for this or any previous visit (from the past 720 hour(s)).  Medical History: Past Medical History  Diagnosis Date  . HLD (hyperlipidemia)   . HTN (hypertension)   . Hemophilia   . OSA on CPAP   . Dyspnea     Medications:  Scheduled:    . sodium chloride   Intravenous Once  . albuterol  2.5 mg Nebulization Q6H  . albuterol  5 mg Nebulization Once  . azithromycin  500 mg Intravenous Q24H  . azithromycin  500 mg Oral Once  . docusate sodium  100 mg Oral BID  . enoxaparin  40 mg Subcutaneous Q24H  . guaiFENesin  600 mg Oral BID  . ipratropium  0.5 mg Nebulization Once  . methimazole  10 mg Oral Daily  . ondansetron  4 mg Intravenous Once  . predniSONE  60 mg Oral Once  . DISCONTD: albuterol  5 mg Nebulization Q4H  . DISCONTD: cefTRIAXone (ROCEPHIN)  IV  1 g Intravenous Q24H   Infusions:    . sodium chloride     Assessment:  40 YOM with PNA on CXR to start Rocephin per Rx (MD also ordered  Azithromycin)  Pending Ucx and Bcx   Plan:   Rocephin 1gm IV q24h, first dose now  F/u LOT  Geoffry Paradise Thi 03/15/2011,3:54 PM

## 2011-03-15 NOTE — H&P (Addendum)
PCP:   Dorrene German, MD, MD   Chief Complaint:  SOB  HPI: Patient is a 40 year old African American male with past medical history significant for hypertension hyperthyroidism and obstructive sleep apnea. Most of the history was obtained from patient's mother as per mother he is been coughing a lot and has not slept in 2 days.  He is now just trying to get some rest. 4 days ago patient presented to the emergency room with flulike symptoms. He was given Jerilynn Som for cough at discharge. Per mother patient has just been getting worse he's been coughing up yellow old blood-tinged mucus and has been getting more short of breath. She checked his oxygen level with her pulse ox and it was in the 80s. He has not been having chest pain. He's also been having subjective fevers and nausea but no vomiting.  Past Medical History: Past Medical History  Diagnosis Date  . HLD (hyperlipidemia)   . HTN (hypertension)   . Hemophilia   . OSA on CPAP   . Dyspnea    Past Surgical History  Procedure Date  . Tonsillectomy     Medications: Prior to Admission medications   Medication Sig Start Date End Date Taking? Authorizing Provider  benzonatate (TESSALON) 100 MG capsule Take 1 capsule (100 mg total) by mouth every 8 (eight) hours. 03/11/11 03/18/11 Yes Heather Zenaida Niece Wingen  docusate sodium (COLACE) 100 MG capsule Take 1 capsule (100 mg total) by mouth every 12 (twelve) hours. 03/11/11 03/21/11 Yes Heather Zenaida Niece Wingen  ibuprofen (ADVIL,MOTRIN) 200 MG tablet Take 400 mg by mouth every 6 (six) hours as needed. For pain.    Yes Historical Provider, MD  methimazole (TAPAZOLE) 10 MG tablet Take 10 mg by mouth daily. Filled in September    Yes Historical Provider, MD  polyethylene glycol powder (GLYCOLAX/MIRALAX) powder Take 17 g by mouth daily. 03/11/11 03/14/11  Heather Zenaida Niece Wingen    Allergies:   Allergies  Allergen Reactions  . Aspirin   . Dilaudid (Hydromorphone Hcl)     Stomach upset.    Social  History: Reports that he has never smoked. He does not have any smokeless tobacco history on file. He reports that he does not drink alcohol or use illicit drugs.He is divorced with 3 daughters.  He is unemployed.  He is a Consulting civil engineer.   Family History: Father hx is unknown.  Mother has COPD,CHF,HTN,Dyslipidemia, Diabetes.  Physical Exam: Filed Vitals:   03/15/11 0826 03/15/11 0827 03/15/11 1137 03/15/11 1306  BP:   133/83 130/75  Pulse:   85 80  Temp:   98.7 F (37.1 C)   TempSrc:   Oral   Resp:   20 18  SpO2: 88% 94% 97% 95%  Gen: Patient appears his stated age.  Cardiovascular: Regular rate rhythm.  Lungs: Rhonchi throughout.  Extremities: No edema   ROS: His mother was at bedside the patient has no further complain other than what's mentioned in the history of present illness.   Labs on Admission:   [facility] 03/15/11 1041  NA 137  K 3.9  CL 101  CO2 --  GLUCOSE 191*  BUN 10  CREATININE 1.20  CALCIUM --  MG --  PHOS --   No results found for this basename: AST:2,ALT:2,ALKPHOS:2,BILITOT:2,PROT:2,ALBUMIN:2 in the last 72 hours No results found for this basename: LIPASE:2,AMYLASE:2 in the last 72 hours  Basename 03/15/11 1041 03/15/11 0930  WBC -- 12.9*  NEUTROABS -- 11.5*  HGB 16.0 15.3  HCT 47.0 43.3  MCV -- 87.5  PLT -- 200   No results found for this basename: CKTOTAL:3,CKMB:3,CKMBINDEX:3,TROPONINI:3 in the last 72 hours No results found for this basename: TSH,T4TOTAL,FREET3,T3FREE,THYROIDAB in the last 72 hours No results found for this basename: VITAMINB12:2,FOLATE:2,FERRITIN:2,TIBC:2,IRON:2,RETICCTPCT:2 in the last 72 hours  Radiological Exams on Admission: Dg Chest 2 View  03/15/2011  *RADIOLOGY REPORT*  Clinical Data: Cough and hypoxia.  CHEST - 2 VIEW  Comparison: 03/11/2011  Findings: Two views of the chest were obtained.  There are increased densities in the medial right lower lung that may be involving the right middle lobe based on the lateral view.   These densities are most prominent on the lateral view.  Upper lungs are clear.  Stable appearance of the heart and mediastinum.  IMPRESSION: There are new densities in the right middle lobe region.  The findings are concerning for an area of pneumonia.  Original Report Authenticated By: Richarda Overlie, M.D.   Dg Chest 2 View  03/11/2011  *RADIOLOGY REPORT*  Clinical Data: cough and SOB  CHEST - 2 VIEW  Comparison: 09/04/2010  Findings: The heart size and mediastinal contours are within normal limits.  Both lungs are clear.  The visualized skeletal structures are unremarkable.  IMPRESSION: No active disease.  Original Report Authenticated By: Rosealee Albee, M.D.    Assessment/Plan Present on Admission:  Pneumonia Chest x-ray in the emergency room shows pneumonia. Her nurse practitioner discussed with the pulmonary doctor. He suggested patient can be discharged home. We were asked to admit the patient for pneumonia. Will start him on Rocephin and Zithromax we'll get blood cultures x2 will put him on neb treatments. If his symptoms improves then he can be discharged home to follow up with his pulmonologist Dr. Maple Hudson.   Hemophilia Stable  HTN Per patient his blood pressure is diet controlled.  OSA-CPAP CPAP machine per respiratory  Hyperthyroidism Continue methimazole, check TSH.  Hyperlipidemia Stable. Followup outpatient with primary doctor.  Time spent on this patient including examination and decision-making process: 45 minutes.  Earlene Plater MD, [place] [phone]    03/15/2011, 1:30 PM

## 2011-03-15 NOTE — ED Provider Notes (Signed)
History     CSN: [id number]  Arrival date & time 03/15/11  0627  7:08 AM HPI Patient reports she's had an upper respiratory tract infection for several weeks. Reports she was seen here 4 days ago for same and diagnosed with influenza. Was given Tessalon and sent home. Reports cough is worsening keeping him awake at night. Reports having a difficult time breathing and using his CPAP do to severe cough. Denies chest pain. Reports he continues to have fever.  Patient is a 40 y.o. male presenting with cough.  Cough This is a new problem. The current episode started more than 1 week ago. The problem occurs constantly. The problem has been gradually worsening. The cough is non-productive. The maximum temperature recorded prior to his arrival was 100 to 100.9 F. The fever has been present for 5 days or more. Associated symptoms include chills, shortness of breath and wheezing. Pertinent negatives include no chest pain, no ear pain, no rhinorrhea and no sore throat. He has tried decongestants for the symptoms. The treatment provided no relief. He is not a smoker.    Past Medical History  Diagnosis Date  . HLD (hyperlipidemia)   . HTN (hypertension)   . Hemophilia   . OSA on CPAP   . Dyspnea     Past Surgical History  Procedure Date  . Tonsillectomy     History reviewed. No pertinent family history.  History  Substance Use Topics  . Smoking status: Never Smoker   . Smokeless tobacco: Not on file  . Alcohol Use: No      Review of Systems  Constitutional: Positive for fever and chills.  HENT: Positive for congestion. Negative for ear pain, sore throat and rhinorrhea.   Respiratory: Positive for cough, shortness of breath and wheezing.   Cardiovascular: Negative for chest pain.  Gastrointestinal: Negative for nausea, vomiting and abdominal pain.  All other systems reviewed and are negative.    Allergies  Aspirin and Dilaudid  Home Medications   Current Outpatient Rx  Name  Route Sig Dispense Refill  . BENZONATATE 100 MG PO CAPS Oral Take 1 capsule (100 mg total) by mouth every 8 (eight) hours. 21 capsule 0  . DOCUSATE SODIUM 100 MG PO CAPS Oral Take 1 capsule (100 mg total) by mouth every 12 (twelve) hours. 60 capsule 0  . IBUPROFEN 200 MG PO TABS Oral Take 400 mg by mouth every 6 (six) hours as needed. For pain.     Marland Kitchen METHIMAZOLE 10 MG PO TABS Oral Take 10 mg by mouth daily. Filled in September     . POLYETHYLENE GLYCOL 3350 PO POWD Oral Take 17 g by mouth daily. 255 g 0    BP 145/80  Pulse 95  Temp 100.5 F (38.1 C)  Resp 20  SpO2 92%  Physical Exam  Vitals reviewed. Constitutional: He is oriented to person, place, and time. He appears well-developed and well-nourished.       Febrile  HENT:  Head: Normocephalic and atraumatic.  Eyes: Conjunctivae are normal. Pupils are equal, round, and reactive to light.  Neck: Normal range of motion. Neck supple.  Cardiovascular: Normal rate, regular rhythm and normal heart sounds.   Pulmonary/Chest: Effort normal and breath sounds normal. He has no wheezes. He has no rales. He exhibits no tenderness.       Coughing  Abdominal: Soft. Bowel sounds are normal.  Neurological: He is alert and oriented to person, place, and time.  Skin: Skin is warm and dry.  No rash noted. No erythema. No pallor.  Psychiatric: He has a normal mood and affect. His behavior is normal.    ED Course  Procedures  ED ECG REPORT   Date: 03/15/2011  EKG Time: 7:28 AM  Rate: 88   Rhythm: normal sinus rhythm,  normal EKG, normal sinus rhythm, unchanged from previous tracings  Axis: nml  Intervals:none  ST&T Change: none  Narrative Interpretation: NML      MDM   8:47 AM Spoke with Dr. Shelle Iron. States Patient can likely go home. Unimpressed with O2 sats of 88%.   11:22 AM Spoke with [facility] hospitalist, Dr. Earlene Plater. Will accept patient for admission to a telemetry bed, team 1. Discussed this with patient and family and they agree  with plan to   Thomasene Lot, [place] 03/15/11 1123

## 2011-03-15 NOTE — ED Notes (Signed)
Pt c/o insomnia due to persistent cough. Pt states he is unable to sleep because he his unable to use his CPAP due to the throat irritation and cough he is experiencing. Pt has hx of sleep apnea.

## 2011-03-15 NOTE — ED Notes (Signed)
Patient transported to X-ray 

## 2011-03-16 LAB — CBC
HCT: 40.7 % (ref 39.0–52.0)
Hemoglobin: 14 g/dL (ref 13.0–17.0)
MCHC: 34.4 g/dL (ref 30.0–36.0)
RBC: 4.66 MIL/uL (ref 4.22–5.81)
WBC: 8.1 10*3/uL (ref 4.0–10.5)

## 2011-03-16 LAB — BASIC METABOLIC PANEL
BUN: 12 mg/dL (ref 6–23)
Chloride: 102 mEq/L (ref 96–112)
GFR calc Af Amer: >90 mL/min (ref >90)
Potassium: 3.1 mEq/L — ABNORMAL LOW (ref 3.5–5.1)

## 2011-03-16 MED ORDER — POTASSIUM CHLORIDE CRYS ER 20 MEQ PO TBCR
40.0000 meq | EXTENDED_RELEASE_TABLET | Freq: Once | ORAL | Status: AC
  Administered 2011-03-16: 40 meq via ORAL
  Filled 2011-03-16: qty 2

## 2011-03-16 MED ORDER — ENSURE IMMUNE HEALTH PO LIQD
237.0000 mL | Freq: Three times a day (TID) | ORAL | Status: DC
  Administered 2011-03-16 – 2011-03-18 (×3): 237 mL via ORAL

## 2011-03-16 NOTE — ED Provider Notes (Signed)
Medical screening examination/treatment/procedure(s) were performed by non-physician practitioner and as supervising physician I was immediately available for consultation/collaboration.  Eddi Hymes L Ladajah Soltys, MD 03/16/11 1642 

## 2011-03-16 NOTE — Progress Notes (Signed)
UR completed 

## 2011-03-16 NOTE — Progress Notes (Signed)
Inpatient Diabetes Program Recommendations  AACE/ADA: New Consensus Statement on Inpatient Glycemic Control (2009)  Target Ranges:  Prepandial:   less than 140 mg/dL      Peak postprandial:   less than 180 mg/dL (1-2 hours)      Critically ill patients:  140 - 180 mg/dL   Reason for Visit: Hyperglycemia  Inpatient Diabetes Program Recommendations Correction (SSI): Add Novolog sensitive tidwc Diet: Change diet to CHO-mod mediumCBGs 191, 254, 137

## 2011-03-16 NOTE — Progress Notes (Signed)
Subjective: Patient feels much better today.  Objective: Vital signs in last 24 hours: Temp:  [98.2 F (36.8 C)-98.6 F (37 C)] 98.2 F (36.8 C) (12/31 1321) Pulse Rate:  [65-95] 74  (12/31 1321) Resp:  [16-20] 18  (12/31 1321) BP: (118-153)/(71-93) 118/71 mmHg (12/31 1321) SpO2:  [90 %-97 %] 92 % (12/31 1546) Weight change:   -Intake/Output from previous day: 12/30 0701 - 12/31 0700 In: 973.3 [P.O.:720; I.V.:253.3] Out: 500 [Urine:500]  Blood pressure 118/71, pulse 74, temperature 98.2 F (36.8 C), temperature source Oral, resp. rate 18, height 5\' 6"  (1.676 m), weight 101.3 kg (223 lb 5.2 oz), SpO2 92.00%. Gen: Patient appears his stated age. Cardiovascular: Regular rate rhythm. Lungs: Rhonchi throughout. Extremities: No edema.   Lab Results: Lab Results  Component Value Date   WBC 8.1 03/16/2011   HGB 14.0 03/16/2011   HCT 40.7 03/16/2011   MCV 87.3 03/16/2011   PLT 196 03/16/2011    BMET  Lab 03/16/11 0419  NA 139  K 3.1*  CL 102  CO2 28  BUN 12  CREATININE 1.13  LABGLOM --  GLUCOSE 137*  CALCIUM 9.4    Studies/Results: Dg Chest 2 View  03/15/2011  *RADIOLOGY REPORT*  Clinical Data: Cough and hypoxia.  CHEST - 2 VIEW  Comparison: 03/11/2011  Findings: Two views of the chest were obtained.  There are increased densities in the medial right lower lung that may be involving the right middle lobe based on the lateral view.  These densities are most prominent on the lateral view.  Upper lungs are clear.  Stable appearance of the heart and mediastinum.  IMPRESSION: There are new densities in the right middle lobe region.  The findings are concerning for an area of pneumonia.  Original Report Authenticated By: Richarda Overlie, M.D.    Medications:  Current Facility-Administered Medications  Medication Dose Route Frequency Provider Last Rate Last Dose  . 0.9 %  sodium chloride infusion   Intravenous Continuous Giovani Neumeister Jarrett-Davis 20 mL/hr at 03/15/11 1720 20 mL/hr at  03/15/11 1720  . acetaminophen (TYLENOL) tablet 650 mg  650 mg Oral Q6H PRN Leiann Sporer Jarrett-Davis       Or  . acetaminophen (TYLENOL) suppository 650 mg  650 mg Rectal Q6H PRN Gwendlyn Hanback Jarrett-Davis      . albuterol (PROVENTIL) (5 MG/ML) 0.5% nebulizer solution 2.5 mg  2.5 mg Nebulization Q6H Edge Mauger Jarrett-Davis   2.5 mg at 03/16/11 1546  . albuterol (PROVENTIL) (5 MG/ML) 0.5% nebulizer solution 2.5 mg  2.5 mg Nebulization Q2H PRN Sawsan Riggio Jarrett-Davis      . alum & mag hydroxide-simeth (MAALOX/MYLANTA) 200-200-20 MG/5ML suspension 30 mL  30 mL Oral Q6H PRN Camrin Gearheart Jarrett-Davis      . antiseptic oral rinse (BIOTENE) solution 15 mL  15 mL Mouth Rinse BID Zebedee Segundo Jarrett-Davis   15 mL at 03/16/11 1018  . azithromycin (ZITHROMAX) 500 mg in dextrose 5 % 250 mL IVPB  500 mg Intravenous Q24H Derreon Consalvo Jarrett-Davis   500 mg at 03/16/11 1653  . cefTRIAXone (ROCEPHIN) 1 g in dextrose 5 % 50 mL IVPB  1 g Intravenous Q24H Thuyvan Thi Marshall Cork, PHARMD   1 g at 03/16/11 1543  . docusate sodium (COLACE) capsule 100 mg  100 mg Oral BID Shataria Crist Jarrett-Davis   100 mg at 03/16/11 1017  . feeding supplement (ENSURE IMMUNE HEALTH) liquid 237 mL  237 mL Oral TID WC Neeley Sedivy Jarrett-Davis   237 mL at 03/16/11 1654  . guaiFENesin (MUCINEX) 12 hr tablet 600  mg  600 mg Oral BID Deyani Hegarty Jarrett-Davis   600 mg at 03/16/11 1017  . guaiFENesin-dextromethorphan (ROBITUSSIN DM) 100-10 MG/5ML syrup 5 mL  5 mL Oral Q4H PRN Abriella Filkins Jarrett-Davis      . methimazole (TAPAZOLE) tablet 10 mg  10 mg Oral Daily Marceline Napierala Jarrett-Davis   10 mg at 03/16/11 1017  . morphine 2 MG/ML injection 2 mg  2 mg Intravenous Q4H PRN Cosimo Schertzer Jarrett-Davis      . ondansetron (ZOFRAN) tablet 4 mg  4 mg Oral Q6H PRN Shagun Wordell Jarrett-Davis       Or  . ondansetron (ZOFRAN) injection 4 mg  4 mg Intravenous Q6H PRN Pattye Meda Jarrett-Davis      . potassium chloride SA (K-DUR,KLOR-CON) CR tablet 40 mEq  40 mEq Oral Once Mikhia Dusek Jarrett-Davis   40 mEq at 03/16/11 1657  . DISCONTD:  enoxaparin (LOVENOX) injection 40 mg  40 mg Subcutaneous Q24H Ulice Follett Jarrett-Davis        Assessment/Plan: Principal Problem: Pneumonia Continue with IV antibiotic. 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 With his hemophilia mother's concern about Lovenox.    LOS: 1 day   Earlene Plater MD, [facility]  [phone] 03/16/2011, 5:49 PM

## 2011-03-17 LAB — BASIC METABOLIC PANEL
Calcium: 9 mg/dL (ref 8.4–10.5)
Creatinine, Ser: 1.12 mg/dL (ref 0.50–1.35)
GFR calc Af Amer: >90 mL/min (ref >90)

## 2011-03-17 LAB — CBC
Platelets: 212 10*3/uL (ref 150–400)
RDW: 13.3 % (ref 11.5–15.5)
WBC: 7.5 10*3/uL (ref 4.0–10.5)

## 2011-03-17 MED ORDER — ALBUTEROL SULFATE HFA 108 (90 BASE) MCG/ACT IN AERS
2.0000 | INHALATION_SPRAY | Freq: Three times a day (TID) | RESPIRATORY_TRACT | Status: DC
  Administered 2011-03-17 (×2): 2 via RESPIRATORY_TRACT
  Filled 2011-03-17: qty 6.7

## 2011-03-17 MED ORDER — MOXIFLOXACIN HCL 400 MG PO TABS
400.0000 mg | ORAL_TABLET | Freq: Every day | ORAL | Status: DC
  Administered 2011-03-17: 400 mg via ORAL
  Filled 2011-03-17 (×2): qty 1

## 2011-03-17 MED ORDER — POTASSIUM CHLORIDE CRYS ER 20 MEQ PO TBCR
40.0000 meq | EXTENDED_RELEASE_TABLET | Freq: Once | ORAL | Status: AC
  Administered 2011-03-17: 40 meq via ORAL
  Filled 2011-03-17: qty 2

## 2011-03-17 NOTE — Progress Notes (Signed)
Patient ambulated in hall with nurse tech. Patient maintained oxygen sats of 92-93% on room air. Will continue to monitor.

## 2011-03-17 NOTE — Progress Notes (Signed)
Subjective: Patient feels much better today.  He would like to see his Pulmonologist prior to being discharged.  Objective: Vital signs in last 24 hours: Temp:  [98 F (36.7 C)-98.7 F (37.1 C)] 98 F (36.7 C) (01/01 1400) Pulse Rate:  [65-99] 65  (01/01 1400) Resp:  [18] 18  (01/01 1400) BP: (118-158)/(74-96) 144/89 mmHg (01/01 1400) SpO2:  [91 %-96 %] 94 % (01/01 1526) Weight change:   -Intake/Output from previous day: 12/31 0701 - 01/01 0700 In: 815 [P.O.:720; I.V.:95] Out: -   Blood pressure 144/89, pulse 65, temperature 98 F (36.7 C), temperature source Oral, resp. rate 18, height 5\' 6"  (1.676 m), weight 101.3 kg (223 lb 5.2 oz), SpO2 94.00%. Gen: Patient appears his stated age. Cardiovascular: Regular rate rhythm. Lungs: Rhonchi throughout. Extremities: No edema.   Lab Results: Lab Results  Component Value Date   WBC 7.5 03/17/2011   HGB 13.9 03/17/2011   HCT 42.1 03/17/2011   MCV 89.2 03/17/2011   PLT 212 03/17/2011    BMET  Lab 03/17/11 0355  NA 137  K 3.4*  CL 101  CO2 26  BUN 11  CREATININE 1.12  LABGLOM --  GLUCOSE 122*  CALCIUM 9.0    Studies/Results: No results found.  Medications:  Current Facility-Administered Medications  Medication Dose Route Frequency Provider Last Rate Last Dose  . 0.9 %  sodium chloride infusion   Intravenous Continuous Raye Wiens Jarrett-Davis 20 mL/hr at 03/15/11 1720 20 mL/hr at 03/15/11 1720  . acetaminophen (TYLENOL) tablet 650 mg  650 mg Oral Q6H PRN Cordelle Dahmen Jarrett-Davis       Or  . acetaminophen (TYLENOL) suppository 650 mg  650 mg Rectal Q6H PRN Quyen Cutsforth Jarrett-Davis      . albuterol (PROVENTIL HFA;VENTOLIN HFA) 108 (90 BASE) MCG/ACT inhaler 2 puff  2 puff Inhalation TID Nahjae Hoeg Jarrett-Davis   2 puff at 03/17/11 1523  . albuterol (PROVENTIL) (5 MG/ML) 0.5% nebulizer solution 2.5 mg  2.5 mg Nebulization Q2H PRN Sharman Garrott Jarrett-Davis      . alum & mag hydroxide-simeth (MAALOX/MYLANTA) 200-200-20 MG/5ML suspension 30 mL  30 mL  Oral Q6H PRN Sarafina Puthoff Jarrett-Davis      . antiseptic oral rinse (BIOTENE) solution 15 mL  15 mL Mouth Rinse BID Leray Garverick Jarrett-Davis   15 mL at 03/17/11 1125  . docusate sodium (COLACE) capsule 100 mg  100 mg Oral BID Alexanderjames Berg Jarrett-Davis   100 mg at 03/17/11 1125  . feeding supplement (ENSURE IMMUNE HEALTH) liquid 237 mL  237 mL Oral TID WC Calia Napp Jarrett-Davis   237 mL at 03/17/11 1126  . guaiFENesin (MUCINEX) 12 hr tablet 600 mg  600 mg Oral BID Hensley Aziz Jarrett-Davis   600 mg at 03/17/11 1125  . guaiFENesin-dextromethorphan (ROBITUSSIN DM) 100-10 MG/5ML syrup 5 mL  5 mL Oral Q4H PRN Skylen Danielsen Jarrett-Davis      . methimazole (TAPAZOLE) tablet 10 mg  10 mg Oral Daily Breton Berns Jarrett-Davis   10 mg at 03/17/11 1125  . morphine 2 MG/ML injection 2 mg  2 mg Intravenous Q4H PRN Vicktoria Muckey Jarrett-Davis      . moxifloxacin (AVELOX) tablet 400 mg  400 mg Oral q1800 Zacaria Pousson Jarrett-Davis   400 mg at 03/17/11 1748  . ondansetron (ZOFRAN) tablet 4 mg  4 mg Oral Q6H PRN Norine Reddington Jarrett-Davis       Or  . ondansetron (ZOFRAN) injection 4 mg  4 mg Intravenous Q6H PRN Emalee Knies Jarrett-Davis      . potassium chloride SA (K-DUR,KLOR-CON) CR tablet 40 mEq  40 mEq Oral Once Shterna Laramee Jarrett-Davis   40 mEq at 03/17/11 1125  . DISCONTD: albuterol (PROVENTIL) (5 MG/ML) 0.5% nebulizer solution 2.5 mg  2.5 mg Nebulization Q6H Ewa Hipp Jarrett-Davis   2.5 mg at 03/17/11 0214  . DISCONTD: azithromycin (ZITHROMAX) 500 mg in dextrose 5 % 250 mL IVPB  500 mg Intravenous Q24H Diron Haddon Jarrett-Davis   500 mg at 03/16/11 2028  . DISCONTD: cefTRIAXone (ROCEPHIN) 1 g in dextrose 5 % 50 mL IVPB  1 g Intravenous Q24H Thuyvan Thi Marshall Cork, PHARMD   1 g at 03/16/11 1543    Assessment/Plan: Principal Problem: Pneumonia, change  Antibiotic to PO, Plan for D/C tomorrow.  Will call his Pulmonologist prior to D/C.   HYPERLIPIDEMIA Stable   HYPERTENSION Diet-controlled   HYPERSOMNIA WITH SLEEP APNEA UNSPECIFIED CPAP machine   Hemophilia SCD for DVT  prophylaxis. With his hemophilia mother's concern about Lovenox.    LOS: 2 days   Earlene Plater MD, [facility]  [phone] 03/17/2011, 6:05 PM

## 2011-03-17 NOTE — Progress Notes (Signed)
Pt has home CPAP unit with FFM and tubing, added humidification, pt states will self administer when ready. RT to assist as needed.

## 2011-03-18 LAB — CBC
Hemoglobin: 15.3 g/dL (ref 13.0–17.0)
MCHC: 34.6 g/dL (ref 30.0–36.0)
Platelets: 237 10*3/uL (ref 150–400)
RBC: 4.96 MIL/uL (ref 4.22–5.81)

## 2011-03-18 LAB — BASIC METABOLIC PANEL
CO2: 25 mEq/L (ref 19–32)
GFR calc non Af Amer: 82 mL/min — ABNORMAL LOW (ref >90)
Glucose, Bld: 130 mg/dL — ABNORMAL HIGH (ref 70–99)
Potassium: 3.9 mEq/L (ref 3.5–5.1)
Sodium: 136 mEq/L (ref 135–145)

## 2011-03-18 MED ORDER — MOXIFLOXACIN HCL 400 MG PO TABS: 400.0000 mg | ORAL_TABLET | Freq: Every day | ORAL | Status: AC

## 2011-03-18 MED ORDER — MOXIFLOXACIN HCL 400 MG PO TABS: 400.0000 mg | ORAL_TABLET | Freq: Every day | ORAL | Status: DC

## 2011-03-18 NOTE — Discharge Summary (Signed)
Physician Discharge Summary  Patient ID: Earl Williams MRN: [id number] DOB/AGE: 1970-09-10 41 y.o.  Admit date: 03/15/2011 Discharge date: 03/18/2011  Discharge Diagnoses:  Pneumonia HYPERLIPIDEMIA HYPERTENSION HYPERSOMNIA WITH SLEEP APNEA UNSPECIFIED Hemophilia Hyperthyroidism   Current Discharge Medication List    START taking these medications   Details  moxifloxacin (AVELOX) 400 MG tablet Take 1 tablet (400 mg total) by mouth daily at 6 PM. Qty: 5 tablet, Refills: 0      CONTINUE these medications which have NOT CHANGED   Details  benzonatate (TESSALON) 100 MG capsule Take 1 capsule (100 mg total) by mouth every 8 (eight) hours. Qty: 21 capsule, Refills: 0    docusate sodium (COLACE) 100 MG capsule Take 1 capsule (100 mg total) by mouth every 12 (twelve) hours. Qty: 60 capsule, Refills: 0    methimazole (TAPAZOLE) 10 MG tablet Take 10 mg by mouth daily. Filled in September       STOP taking these medications     ibuprofen (ADVIL,MOTRIN) 200 MG tablet      polyethylene glycol powder (GLYCOLAX/MIRALAX) powder         Discharge Orders    Future Orders Please Complete By Expires   Increase activity slowly         Follow-up Information    Follow up with AVBUERE,EDWIN A, MD in 1 week.      Follow up with Waymon Budge, MD in 2 weeks. (As needed)    Contact information:   [address] [employer] [place] [address] [phone]          Disposition: Home or Self Care  Discharged Condition: Stable  Full Code   Consults:  None HPI Patient is a 41 year old African American male with past medical history significant for hypertension hyperthyroidism and obstructive sleep apnea. Most of the history was obtained from patient's mother as per mother he is been coughing a lot and has not slept in 2 days. He is now just trying to get some rest. 4 days ago patient presented to the emergency room with  flulike symptoms. He was given Jerilynn Som for cough at discharge. Per mother patient has just been getting worse he's been coughing up yellow old blood-tinged mucus and has been getting more short of breath. She checked his oxygen level with her pulse ox and it was in the 80s. He has not been having chest pain. He's also been having subjective fevers and nausea but no vomiting.   PMH:  As per H & P FH: As per  H & P SH: As per H & P Med: as per H & P Allergies and ROS: as per H&P   Discharge Exam: Blood pressure 127/83, pulse 70, temperature 97.3 F (36.3 C), temperature source Oral, resp. rate 18, height 5\' 6"  (1.676 m), weight 101.3 kg (223 lb 5.2 oz), SpO2 93.00%.  Gen: Patient appears his stated age.  Cardiovascular: Regular rate rhythm.  Lungs: Rhonchi throughout.  Extremities: No edema  Hospital Course:  Pneumonia Patient was admitted to the hospital with pneumonia. He was treated with IV Rocephin and Zithromax. Felt that his symptoms improved he was transitioned to by mouth Avelox. Patient will be discharged on by mouth Avelox to complete the course of therapy. He will follow up outpatient with his pulmonologist Dr. Maple Hudson.  HYPERLIPIDEMIA Followup outpatient.  HYPERTENSION Diet-controlled. Blood pressure has been stable throughout his hospitalization.  HYPERSOMNIA WITH SLEEP APNEA UNSPECIFIED Patient was continued on his CPAP machine.  He'll followup outpatient.  Hemophilia Stable  Hyperthyroidism He was continued on methimazole. He will followup with his PCP.  Labs:   Results for orders placed during the hospital encounter of 03/15/11 (from the past 48 hour(s))  BASIC METABOLIC PANEL     Status: Abnormal   Collection Time   03/17/11  3:55 AM      Component Value Range Comment   Sodium 137  135 - 145 (mEq/L)    Potassium 3.4 (*) 3.5 - 5.1 (mEq/L)    Chloride 101  96 - 112 (mEq/L)    CO2 26  19 - 32 (mEq/L)    Glucose, Bld 122 (*) 70 - 99 (mg/dL)    BUN 11  6 -  23 (mg/dL)    Creatinine, Ser 1.61  0.50 - 1.35 (mg/dL)    Calcium 9.0  8.4 - 10.5 (mg/dL)    GFR calc non Af Amer 80 (*) >90 (mL/min)    GFR calc Af Amer >90  >90 (mL/min)   CBC     Status: Normal   Collection Time   03/17/11  3:55 AM      Component Value Range Comment   WBC 7.5  4.0 - 10.5 (K/uL)    RBC 4.72  4.22 - 5.81 (MIL/uL)    Hemoglobin 13.9  13.0 - 17.0 (g/dL)    HCT 09.6  04.5 - 40.9 (%)    MCV 89.2  78.0 - 100.0 (fL)    MCH 29.4  26.0 - 34.0 (pg)    MCHC 33.0  30.0 - 36.0 (g/dL)    RDW 81.1  91.4 - 78.2 (%)    Platelets 212  150 - 400 (K/uL)   CBC     Status: Normal   Collection Time   03/18/11  3:46 AM      Component Value Range Comment   WBC 7.6  4.0 - 10.5 (K/uL)    RBC 4.96  4.22 - 5.81 (MIL/uL)    Hemoglobin 15.3  13.0 - 17.0 (g/dL)    HCT 95.6  21.3 - 08.6 (%)    MCV 89.1  78.0 - 100.0 (fL)    MCH 30.8  26.0 - 34.0 (pg)    MCHC 34.6  30.0 - 36.0 (g/dL)    RDW 57.8  46.9 - 62.9 (%)    Platelets 237  150 - 400 (K/uL)   BASIC METABOLIC PANEL     Status: Abnormal   Collection Time   03/18/11  3:46 AM      Component Value Range Comment   Sodium 136  135 - 145 (mEq/L)    Potassium 3.9  3.5 - 5.1 (mEq/L)    Chloride 102  96 - 112 (mEq/L)    CO2 25  19 - 32 (mEq/L)    Glucose, Bld 130 (*) 70 - 99 (mg/dL)    BUN 11  6 - 23 (mg/dL)    Creatinine, Ser 5.28  0.50 - 1.35 (mg/dL)    Calcium 9.3  8.4 - 10.5 (mg/dL)    GFR calc non Af Amer 82 (*) >90 (mL/min)    GFR calc Af Amer >90  >90 (mL/min)     Diagnostics:  Dg Chest 2 View  03/15/2011  *RADIOLOGY REPORT*  Clinical Data: Cough and hypoxia.  CHEST - 2 VIEW  Comparison: 03/11/2011  Findings: Two views of the chest were obtained.  There are increased densities in the medial right lower lung that may be involving the right middle lobe based on the  lateral view.  These densities are most prominent on the lateral view.  Upper lungs are clear.  Stable appearance of the heart and mediastinum.  IMPRESSION: There are new  densities in the right middle lobe region.  The findings are concerning for an area of pneumonia.  Original Report Authenticated By: Richarda Overlie, M.D.   Dg Chest 2 View  03/11/2011  *RADIOLOGY REPORT*  Clinical Data: cough and SOB  CHEST - 2 VIEW  Comparison: 09/04/2010  Findings: The heart size and mediastinal contours are within normal limits.  Both lungs are clear.  The visualized skeletal structures are unremarkable.  IMPRESSION: No active disease.  Original Report Authenticated By: Rosealee Albee, M.D.   Time spent with patient and doing this discharge is approximately 35 min.  SignedEarlene Plater MD, [facility] 03/18/2011, 1:02 PM

## 2011-03-18 NOTE — ED Provider Notes (Signed)
Medical screening examination/treatment/procedure(s) were conducted as a shared visit with non-physician practitioner(s) and myself.  I personally evaluated the patient during the encounter   Suzi Roots, MD 03/18/11 [phone]

## 2011-03-18 NOTE — Progress Notes (Signed)
Patient discharged home, all discharge medications and instructions reviewed and questions answered. Pt to be assisted to vehicle by wheelchair.  

## 2011-03-21 LAB — CULTURE, BLOOD (ROUTINE X 2)
Culture  Setup Time: 201212301853
Culture: NO GROWTH

## 2012-01-29 ENCOUNTER — Ambulatory Visit: Payer: Medicaid Other | Admitting: Internal Medicine

## 2012-03-10 ENCOUNTER — Ambulatory Visit: Payer: Medicaid Other | Admitting: Internal Medicine

## 2012-04-12 IMAGING — CR DG CHEST 2V
2 series · 2 of 2 positions shown · non-contrast
Comparison: Chest x-ray 10/25/2009.

CLINICAL DATA: Weakness.

CHEST - 2 VIEW

[w chest pa]
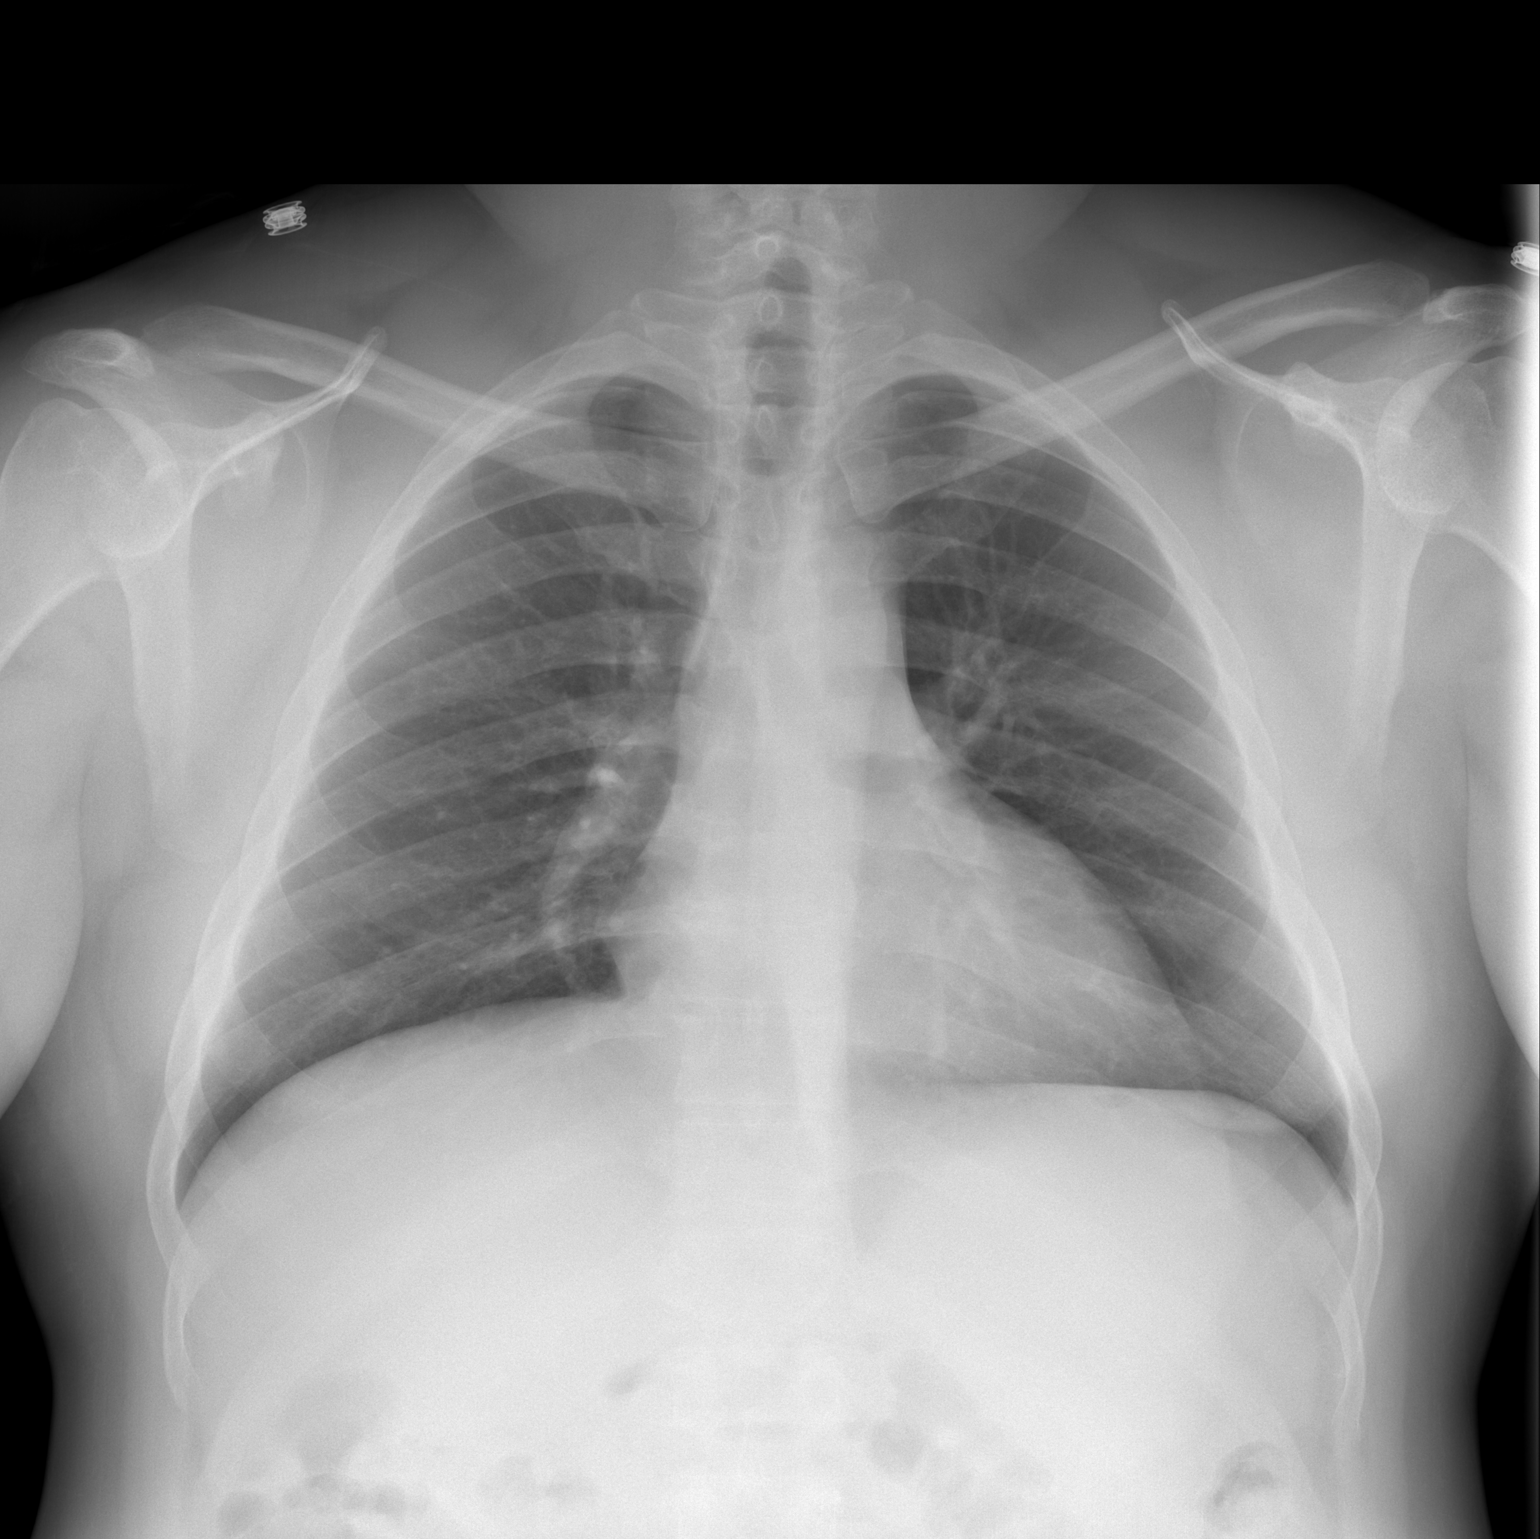

[w chest lat]
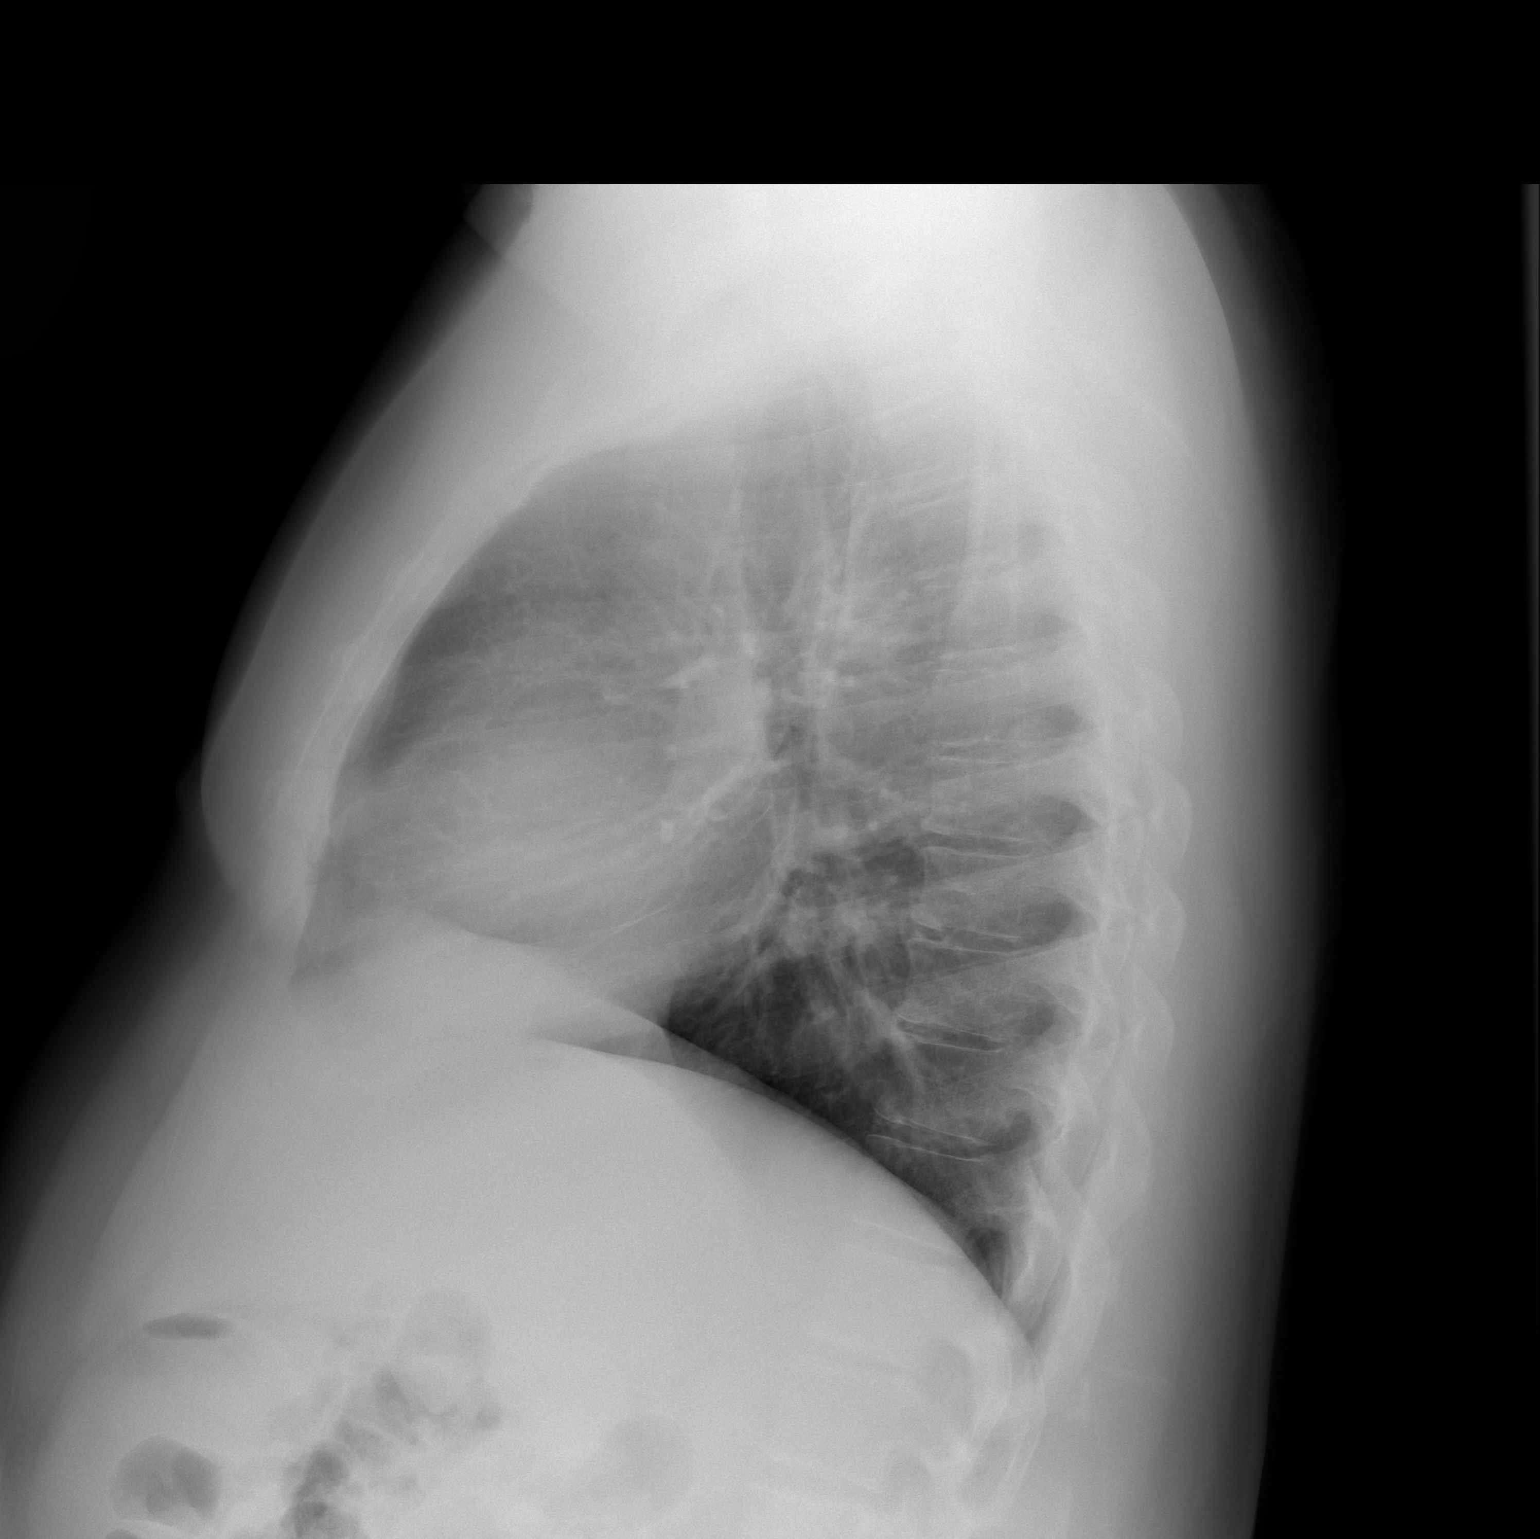

[2 of 2 positions shown; findings below may reference images not displayed]

FINDINGS: The cardiac silhouette, mediastinal and hilar contours
are within normal limits.  The lungs are clear.  The bony thorax is
intact.
IMPRESSION: No acute cardiopulmonary findings.

## 2012-04-13 ENCOUNTER — Ambulatory Visit: Payer: Medicaid Other | Admitting: Internal Medicine

## 2012-04-18 ENCOUNTER — Encounter: Payer: Self-pay | Admitting: Internal Medicine

## 2012-04-18 ENCOUNTER — Ambulatory Visit (INDEPENDENT_AMBULATORY_CARE_PROVIDER_SITE_OTHER): Payer: Medicaid Other | Admitting: Internal Medicine

## 2012-04-18 VITALS — BP 140/88 | HR 74 | Ht 66.0 in | Wt 242.2 lb

## 2012-04-18 DIAGNOSIS — G4733 Obstructive sleep apnea (adult) (pediatric): Secondary | ICD-10-CM

## 2012-04-18 NOTE — Progress Notes (Signed)
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 NPSG 05/05/08 AHI 92.5/ hr, CPAP titrated to 19> AHI 0/hr. He sometimes gets annoyed having CPAP on his face, but states that the pressure is comfortable. Headache if he takes CPAP off in his sleep. No longer needing temazepam. Does sleep elevated on a wedge.  ROS-see HPI Constitutional:   No-   weight loss, night sweats, fevers, chills, fatigue, lassitude. HEENT:   No-  headaches, difficulty swallowing, tooth/dental problems, sore throat,       No-  sneezing, itching, ear ache, nasal congestion, post nasal drip,  CV:  No-   chest pain, orthopnea, PND, swelling in lower extremities, anasarca,                                  dizziness, palpitations Resp: No-   shortness of breath with exertion or at rest.              No-   productive cough,  No non-productive cough,  No- coughing up of blood.              No-   change in color of mucus.  No- wheezing.   Skin: No-   rash or lesions. GI:  No-   heartburn, indigestion, abdominal pain, nausea, vomiting,  GU:  MS:  No-   joint pain or swelling.   Neuro-     nothing unusual Psych:  No- change in mood or affect. No depression or anxiety.  No memory loss.  OBJ- Physical Exam General- Alert, Oriented, Affect-appropriate, Distress- none acute. Stocky body build Skin- rash-none, lesions- none, excoriation- none Lymphadenopathy- none Head- atraumatic            Eyes- Gross vision intact, PERRLA, conjunctivae and secretions clear            Ears- Hearing, canals-normal            Nose- Clear, no-Septal dev, mucus, polyps, erosion, perforation             Throat- Mallampati III-IV , mucosa clear , drainage- none, tonsils- atrophic Neck- flexible , trachea midline, no stridor , thyroid nl, carotid no bruit Chest - symmetrical excursion , unlabored          Heart/CV- RRR , no murmur , no gallop  , no rub, nl s1 s2                           - JVD- none , edema- none, stasis changes- none, varices- none           Lung- clear to P&A, wheeze- none, cough- none , dullness-none, rub- none           Chest wall-  Abd-  Br/ Gen/ Rectal- Not done, not indicated Extrem- cyanosis- none, clubbing, none, atrophy- none, strength- nl Neuro- grossly intact to observation

## 2012-04-18 NOTE — Patient Instructions (Addendum)
Ok to try turning down the CPAP humidifier to see if that will reduce saliva  Consider trying Benadryl at bedtime to see if that will dry your mouth comfortably  We can try reassesing your pressure setting if that could make you more comfortable

## 2012-04-26 NOTE — Assessment & Plan Note (Signed)
Good compliance and control. He prefers using CPAP without humidifier which we discussed. We can consider reassessing the pressure.

## 2012-12-12 ENCOUNTER — Emergency Department (HOSPITAL_BASED_OUTPATIENT_CLINIC_OR_DEPARTMENT_OTHER): Payer: Worker's Compensation

## 2012-12-12 ENCOUNTER — Encounter (HOSPITAL_BASED_OUTPATIENT_CLINIC_OR_DEPARTMENT_OTHER): Payer: Self-pay

## 2012-12-12 ENCOUNTER — Emergency Department (HOSPITAL_BASED_OUTPATIENT_CLINIC_OR_DEPARTMENT_OTHER)
Admission: EM | Admit: 2012-12-12 | Discharge: 2012-12-12 | Disposition: A | Discharge status: Other Acute Inpatient Hospital | Payer: Worker's Compensation | Attending: Emergency Medicine | Admitting: Emergency Medicine

## 2012-12-12 DIAGNOSIS — D66 Hereditary factor VIII deficiency: Secondary | ICD-10-CM | POA: Insufficient documentation

## 2012-12-12 DIAGNOSIS — E785 Hyperlipidemia, unspecified: Secondary | ICD-10-CM | POA: Insufficient documentation

## 2012-12-12 DIAGNOSIS — Y9289 Other specified places as the place of occurrence of the external cause: Secondary | ICD-10-CM | POA: Insufficient documentation

## 2012-12-12 DIAGNOSIS — S6990XA Unspecified injury of unspecified wrist, hand and finger(s), initial encounter: Secondary | ICD-10-CM | POA: Insufficient documentation

## 2012-12-12 DIAGNOSIS — Y9389 Activity, other specified: Secondary | ICD-10-CM | POA: Insufficient documentation

## 2012-12-12 DIAGNOSIS — R110 Nausea: Secondary | ICD-10-CM | POA: Insufficient documentation

## 2012-12-12 DIAGNOSIS — W19XXXA Unspecified fall, initial encounter: Secondary | ICD-10-CM

## 2012-12-12 DIAGNOSIS — Z9981 Dependence on supplemental oxygen: Secondary | ICD-10-CM | POA: Insufficient documentation

## 2012-12-12 DIAGNOSIS — S0003XA Contusion of scalp, initial encounter: Secondary | ICD-10-CM | POA: Insufficient documentation

## 2012-12-12 DIAGNOSIS — I10 Essential (primary) hypertension: Secondary | ICD-10-CM | POA: Insufficient documentation

## 2012-12-12 DIAGNOSIS — S0990XA Unspecified injury of head, initial encounter: Secondary | ICD-10-CM

## 2012-12-12 DIAGNOSIS — G4733 Obstructive sleep apnea (adult) (pediatric): Secondary | ICD-10-CM | POA: Insufficient documentation

## 2012-12-12 DIAGNOSIS — Z79899 Other long term (current) drug therapy: Secondary | ICD-10-CM | POA: Insufficient documentation

## 2012-12-12 DIAGNOSIS — S59909A Unspecified injury of unspecified elbow, initial encounter: Secondary | ICD-10-CM | POA: Insufficient documentation

## 2012-12-12 DIAGNOSIS — Y990 Civilian activity done for income or pay: Secondary | ICD-10-CM | POA: Insufficient documentation

## 2012-12-12 DIAGNOSIS — W11XXXA Fall on and from ladder, initial encounter: Secondary | ICD-10-CM | POA: Insufficient documentation

## 2012-12-12 LAB — CBC WITH DIFFERENTIAL/PLATELET
Eosinophils Absolute: 0.1 10*3/uL (ref 0.0–0.7)
Hemoglobin: 16 g/dL (ref 13.0–17.0)
Lymphocytes Relative: 19 % (ref 12–46)
Lymphs Abs: 2 10*3/uL (ref 0.7–4.0)
MCH: 31.5 pg (ref 26.0–34.0)
Monocytes Relative: 7 % (ref 3–12)
Neutrophils Relative %: 72 % (ref 43–77)
RBC: 5.08 MIL/uL (ref 4.22–5.81)
WBC: 10.1 10*3/uL (ref 4.0–10.5)

## 2012-12-12 LAB — URINALYSIS, ROUTINE W REFLEX MICROSCOPIC
Bilirubin Urine: NEGATIVE
Glucose, UA: NEGATIVE mg/dL
Hgb urine dipstick: NEGATIVE
Specific Gravity, Urine: 1.022 (ref 1.005–1.030)
pH: 5.5 (ref 5.0–8.0)

## 2012-12-12 LAB — BASIC METABOLIC PANEL
BUN: 16 mg/dL (ref 6–23)
CO2: 28 mEq/L (ref 19–32)
Chloride: 101 mEq/L (ref 96–112)
Glucose, Bld: 92 mg/dL (ref 70–99)
Potassium: 4 mEq/L (ref 3.5–5.1)
Sodium: 138 mEq/L (ref 135–145)

## 2012-12-12 MED ORDER — FACTOR IX COMPLEX 500 UNITS IV SOLR
50.0000 [IU]/kg | Freq: Once | INTRAVENOUS | Status: AC
  Administered 2012-12-12: 4875 [IU] via INTRAVENOUS
  Filled 2012-12-12: qty 4875

## 2012-12-12 NOTE — ED Notes (Signed)
Report given to Shannon RN with [facility] 

## 2012-12-12 NOTE — ED Provider Notes (Signed)
CSN: [id number]     Arrival date & time 12/12/12  1437 History   First MD Initiated Contact with Patient 12/12/12 1529     Chief Complaint  Patient presents with  . Head Injury   (Consider location/radiation/quality/duration/timing/severity/associated sxs/prior Treatment) HPI  Past Medical History  Diagnosis Date  . HLD (hyperlipidemia)   . HTN (hypertension)   . Hemophilia   . OSA on CPAP   . Dyspnea    Past Surgical History  Procedure Laterality Date  . Tonsillectomy     No family history on file. History  Substance Use Topics  . Smoking status: Never Smoker   . Smokeless tobacco: Not on file  . Alcohol Use: No    Review of Systems  Allergies  Aspirin and Dilaudid  Home Medications   Current Outpatient Rx  Name  Route  Sig  Dispense  Refill  . lisinopril (PRINIVIL,ZESTRIL) 10 MG tablet   Oral   Take 15 mg by mouth daily.         . methimazole (TAPAZOLE) 10 MG tablet   Oral   Take 10 mg by mouth daily. Filled in September           BP 135/77  Pulse 74  Temp(Src) 98.4 F (36.9 C) (Oral)  Resp 20  Ht 5\' 6"  (1.676 m)  Wt 215 lb (97.523 kg)  BMI 34.72 kg/m2  SpO2 100% Physical Exam  ED Course  Procedures (including critical care time) Labs Review Labs Reviewed  BASIC METABOLIC PANEL - Abnormal; Notable for the following:    GFR calc non Af Amer 81 (*)    All other components within normal limits  CBC WITH DIFFERENTIAL  URINALYSIS, ROUTINE W REFLEX MICROSCOPIC   Imaging Review Dg Elbow Complete Right  12/12/2012   CLINICAL DATA:  Fall off ladder with right elbow pain.  EXAM: RIGHT ELBOW - COMPLETE 3+ VIEW  COMPARISON:  12/27/2005  FINDINGS: No evidence for an acute fracture or dislocation. No definite joint effusion. There may be mild degenerative changes at the coronoid process. There is an oval-shaped soft tissue structure along the medial aspect of the elbow and this was present on the previous examination.  IMPRESSION: No acute bone  abnormality to the right elbow.   Electronically Signed   By: Richarda Overlie M.D.   On: 12/12/2012 16:29   Ct Head Wo Contrast  12/12/2012   CLINICAL DATA:  Fall and posterior headache.  EXAM: CT HEAD WITHOUT CONTRAST  CT CERVICAL SPINE WITHOUT CONTRAST  TECHNIQUE: Multidetector CT imaging of the head and cervical spine was performed following the standard protocol without intravenous contrast. Multiplanar CT image reconstructions of the cervical spine were also generated.  COMPARISON:  Head CT 10/25/2005  FINDINGS: CT HEAD FINDINGS  No evidence for acute hemorrhage, mass lesion, midline shift, hydrocephalus or large infarct. Normal appearance of the ventricles and basal cisterns. The paranasal sinuses are clear. Negative for acute fracture.  CT CERVICAL SPINE FINDINGS  The lung apices are clear without a pneumothorax. Negative for an acute fracture or dislocation. There is no significant soft tissue swelling in the neck. Alignment of the cervical spine is normal. The vertebral body heights are maintained.  IMPRESSION: CT HEAD IMPRESSION  Negative head CT.  CT CERVICAL SPINE IMPRESSION  Negative cervical spine CT. No acute abnormality.   Electronically Signed   By: Richarda Overlie M.D.   On: 12/12/2012 16:23   Ct Cervical Spine Wo Contrast  12/12/2012   CLINICAL DATA:  Fall and posterior headache.  EXAM: CT HEAD WITHOUT CONTRAST  CT CERVICAL SPINE WITHOUT CONTRAST  TECHNIQUE: Multidetector CT imaging of the head and cervical spine was performed following the standard protocol without intravenous contrast. Multiplanar CT image reconstructions of the cervical spine were also generated.  COMPARISON:  Head CT 10/25/2005  FINDINGS: CT HEAD FINDINGS  No evidence for acute hemorrhage, mass lesion, midline shift, hydrocephalus or large infarct. Normal appearance of the ventricles and basal cisterns. The paranasal sinuses are clear. Negative for acute fracture.  CT CERVICAL SPINE FINDINGS  The lung apices are clear without a  pneumothorax. Negative for an acute fracture or dislocation. There is no significant soft tissue swelling in the neck. Alignment of the cervical spine is normal. The vertebral body heights are maintained.  IMPRESSION: CT HEAD IMPRESSION  Negative head CT.  CT CERVICAL SPINE IMPRESSION  Negative cervical spine CT. No acute abnormality.   Electronically Signed   By: Richarda Overlie M.D.   On: 12/12/2012 16:23    MDM   1. Fall, initial encounter   2. Head injury, initial encounter   3. Hemophilia A   I personally performed the services described in this documentation, which was scribed in my presence. The recorded information has been reviewed and considered.    Hilario Quarry, MD 12/12/12 [phone]

## 2012-12-12 NOTE — ED Notes (Signed)
Dr. Rosalia Hammers spoke with pharmacy staff at [facility] about the Factor 8 and about needing the meds asaap due to Pt. Fall and being in need of Factor 8.

## 2012-12-12 NOTE — ED Notes (Addendum)
Pt. To receive factor 8 when it comes from [facility].  Pt. Mother is also bringing some from home.  Dr. Rosalia Hammers aware of the need and encouraged [facility] to expedite the arrival.

## 2012-12-12 NOTE — ED Notes (Signed)
Provider at bedside

## 2012-12-12 NOTE — ED Notes (Signed)
Kogenate FS  Recombinant given to Pt.   With Lot # [id number]  Exp.  18-July 2016  IU 1034

## 2012-12-12 NOTE — ED Notes (Signed)
Pt reports head injury after he "slid down on ladder". Denies LOC

## 2012-12-12 NOTE — ED Provider Notes (Signed)
CSN: [id number]     Arrival date & time 12/12/12  1437 History  This chart was scribed for Hilario Quarry, MD by Quintella Reichert, ED scribe.  This patient was seen in room MH01/MH01 and the patient's care was started at 3:34 PM.  Chief Complaint  Patient presents with  . Head Injury    The history is provided by the patient. No language interpreter was used.    HPI Comments: Earl Williams is a 42 y.o. male with h/o hemophilia (mild factor VIII deficiency) who presents to the Emergency Department complaining of an un-witnessed head injury sustained 2 hours ago.  Pt states that he was on a ladder at work when it fell to the right and he fell along with the ladder and hit the back of his head and his right elbow.  He denies LOC and has been ambulatory.  He now complains of a moderate headache, moderate right elbow pain, and mild nausea.  He denies emesis, visual changes, weakness to extremities, or neck pain.  He states that he always keeps factor VIII at home and "my mother is on the way home to get it for me."  He last had it given 2 years ago due to ruptured blood vessels in his ankle.  Pt states PCP is Dr. Ferdie Ping    Past Medical History  Diagnosis Date  . HLD (hyperlipidemia)   . HTN (hypertension)   . Hemophilia   . OSA on CPAP   . Dyspnea     Past Surgical History  Procedure Laterality Date  . Tonsillectomy      No family history on file.   History  Substance Use Topics  . Smoking status: Never Smoker   . Smokeless tobacco: Not on file  . Alcohol Use: No     Review of Systems  HENT: Negative for neck pain.   Eyes: Negative for visual disturbance.  Gastrointestinal: Positive for nausea. Negative for vomiting.  Musculoskeletal: Positive for arthralgias.  Neurological: Positive for headaches. Negative for weakness.  All other systems reviewed and are negative.     Allergies  Aspirin and Dilaudid  Home Medications   Current Outpatient  Rx  Name  Route  Sig  Dispense  Refill  . lisinopril (PRINIVIL,ZESTRIL) 10 MG tablet   Oral   Take 15 mg by mouth daily.         . methimazole (TAPAZOLE) 10 MG tablet   Oral   Take 10 mg by mouth daily. Filled in September           BP 158/90  Pulse 64  Temp(Src) 98.8 F (37.1 C) (Oral)  Resp 16  Ht 5\' 6"  (1.676 m)  Wt 215 lb (97.523 kg)  BMI 34.72 kg/m2  SpO2 100%  Physical Exam  Nursing note and vitals reviewed. Constitutional: He is oriented to person, place, and time. He appears well-developed and well-nourished. No distress.  HENT:  Head: Normocephalic. Head is with contusion.  Right Ear: Tympanic membrane and ear canal normal.  Left Ear: Tympanic membrane and ear canal normal.  6-cm diameter swollen contused area on occiput, no step-offs noted Face reveals no tenderness to bony palpation.  No step-offs.  Dentition intact. No hemotympanum External canals normal No fluid from ears  Eyes: EOM are normal.  Neck: Normal range of motion. Neck supple. No tracheal deviation present.  No tenderness to cervical spine.  Full active ROM. Trachea midline.  Cardiovascular: Normal rate, regular rhythm and normal heart sounds.  No murmur heard. DP and PT pulses intact bilaterally  Pulmonary/Chest: Effort normal and breath sounds normal. No respiratory distress. He has no wheezes. He has no rales.  No signs of trauma to chest wall  Abdominal: Soft. There is no tenderness. There is no rebound.  No signs of trauma  Musculoskeletal: Normal range of motion. He exhibits tenderness.  Tender over ulnar aspect of right elbow.  Full active ROM of shoulder, elbow and wrist.  No external signs of trauma. Lower extremities show no signs of deformity or trauma Full active ROM of bilateral hips and knees No external signs of trauma to back No tenderness to palpation of thoracic and lumbar vertebral bodies  Neurological: He is alert and oriented to person, place, and time. He has normal  strength. No cranial nerve deficit. He displays a negative Romberg sign. GCS eye subscore is 4. GCS verbal subscore is 5. GCS motor subscore is 6. He displays no Babinski's sign on the right side. He displays no Babinski's sign on the left side.  Reflex Scores:      Bicep reflexes are 1+ on the right side and 1+ on the left side.      Patellar reflexes are 2+ on the right side and 2+ on the left side. Upper extremity motor strength 5/5 bilateral shoulders, elbows, and wrists Lower extremity motor strength 5/5 bilateral hips, knees, ankles  Skin: Skin is warm and dry.  Psychiatric: He has a normal mood and affect. His behavior is normal.    ED Course  Procedures (including critical care time)  DIAGNOSTIC STUDIES: Oxygen Saturation is 100% on room air, normal by my interpretation.    COORDINATION OF CARE: 3:41 PM-Discussed treatment plan which includes imaging and consult to hematologist with pt at bedside and pt agreed to plan.   3:54 PM-Consult complete with Dr. Dorothy Spark at [facility]. Patient case explained and discussed. Dr. Rene Kocher recommends to administer 50 mg/kg factor VIII as soon as possible. Call ended at 3:59 PM.   Labs Review Labs Reviewed - No data to display  Imaging Review Dg Elbow Complete Right  12/12/2012   CLINICAL DATA:  Fall off ladder with right elbow pain.  EXAM: RIGHT ELBOW - COMPLETE 3+ VIEW  COMPARISON:  12/27/2005  FINDINGS: No evidence for an acute fracture or dislocation. No definite joint effusion. There may be mild degenerative changes at the coronoid process. There is an oval-shaped soft tissue structure along the medial aspect of the elbow and this was present on the previous examination.  IMPRESSION: No acute bone abnormality to the right elbow.   Electronically Signed   By: Richarda Overlie M.D.   On: 12/12/2012 16:29   Ct Head Wo Contrast  12/12/2012   CLINICAL DATA:  Fall and posterior headache.  EXAM: CT HEAD WITHOUT  CONTRAST  CT CERVICAL SPINE WITHOUT CONTRAST  TECHNIQUE: Multidetector CT imaging of the head and cervical spine was performed following the standard protocol without intravenous contrast. Multiplanar CT image reconstructions of the cervical spine were also generated.  COMPARISON:  Head CT 10/25/2005  FINDINGS: CT HEAD FINDINGS  No evidence for acute hemorrhage, mass lesion, midline shift, hydrocephalus or large infarct. Normal appearance of the ventricles and basal cisterns. The paranasal sinuses are clear. Negative for acute fracture.  CT CERVICAL SPINE FINDINGS  The lung apices are clear without a pneumothorax. Negative for an acute fracture or dislocation. There is no significant soft tissue swelling in the neck. Alignment of  the cervical spine is normal. The vertebral body heights are maintained.  IMPRESSION: CT HEAD IMPRESSION  Negative head CT.  CT CERVICAL SPINE IMPRESSION  Negative cervical spine CT. No acute abnormality.   Electronically Signed   By: Richarda Overlie M.D.   On: 12/12/2012 16:23   Ct Cervical Spine Wo Contrast  12/12/2012   CLINICAL DATA:  Fall and posterior headache.  EXAM: CT HEAD WITHOUT CONTRAST  CT CERVICAL SPINE WITHOUT CONTRAST  TECHNIQUE: Multidetector CT imaging of the head and cervical spine was performed following the standard protocol without intravenous contrast. Multiplanar CT image reconstructions of the cervical spine were also generated.  COMPARISON:  Head CT 10/25/2005  FINDINGS: CT HEAD FINDINGS  No evidence for acute hemorrhage, mass lesion, midline shift, hydrocephalus or large infarct. Normal appearance of the ventricles and basal cisterns. The paranasal sinuses are clear. Negative for acute fracture.  CT CERVICAL SPINE FINDINGS  The lung apices are clear without a pneumothorax. Negative for an acute fracture or dislocation. There is no significant soft tissue swelling in the neck. Alignment of the cervical spine is normal. The vertebral body heights are maintained.   IMPRESSION: CT HEAD IMPRESSION  Negative head CT.  CT CERVICAL SPINE IMPRESSION  Negative cervical spine CT. No acute abnormality.   Electronically Signed   By: Richarda Overlie M.D.   On: 12/12/2012 16:23    MDM  No diagnosis found. I discussed the patient's care with his hematologist at [facility] immediately after my evaluation.  She advises factor 8 asap do no hold for cts.  Factor 8 ordered and must be sent from [facility].  I spoke with the pharmacist [phone] and advised this must be sent as quickly as possible and could not wait for courier to run route.  We are currently awaiting the arrival of the medicine.  The patient had also advised his mother had gone home to get his factor, but she has also not returned.  The dose to be administered is 50 units/kg.  5:02 PM  5:11 PM Factor 8 arrived and being drawn up by rn 5:41 PM Patient remains hemodynamically stable but continues to have headache.  Neuro exam continues normal.  [facility] page for transfer.   5:53 PM Patient care discussed with Dr. Stephenie Acres at [facility] and patient to be transferred- they will call back with bed assignment.   Hilario Quarry, MD 12/12/12 [phone]

## 2012-12-12 NOTE — ED Notes (Signed)
In to check on Pt. And he is sleeping with eyes closed easily awakened with no complaints.  Pt. Had milk and crackers per his request earlier.

## 2013-04-18 ENCOUNTER — Ambulatory Visit: Payer: Medicaid Other | Admitting: Internal Medicine

## 2013-04-19 ENCOUNTER — Encounter: Payer: Self-pay | Admitting: Internal Medicine

## 2013-06-25 ENCOUNTER — Emergency Department (HOSPITAL_COMMUNITY): Payer: Medicaid Other

## 2013-06-25 ENCOUNTER — Emergency Department (HOSPITAL_COMMUNITY)
Admission: EM | Admit: 2013-06-25 | Discharge: 2013-06-25 | Disposition: A | Discharge status: Home / Self Care | Payer: Medicaid Other | Attending: Emergency Medicine | Admitting: Emergency Medicine

## 2013-06-25 ENCOUNTER — Encounter (HOSPITAL_COMMUNITY): Payer: Self-pay | Admitting: Emergency Medicine

## 2013-06-25 DIAGNOSIS — M25519 Pain in unspecified shoulder: Secondary | ICD-10-CM | POA: Insufficient documentation

## 2013-06-25 DIAGNOSIS — Z9981 Dependence on supplemental oxygen: Secondary | ICD-10-CM | POA: Insufficient documentation

## 2013-06-25 DIAGNOSIS — G4733 Obstructive sleep apnea (adult) (pediatric): Secondary | ICD-10-CM | POA: Insufficient documentation

## 2013-06-25 DIAGNOSIS — I10 Essential (primary) hypertension: Secondary | ICD-10-CM | POA: Insufficient documentation

## 2013-06-25 DIAGNOSIS — E785 Hyperlipidemia, unspecified: Secondary | ICD-10-CM | POA: Insufficient documentation

## 2013-06-25 DIAGNOSIS — M546 Pain in thoracic spine: Secondary | ICD-10-CM | POA: Insufficient documentation

## 2013-06-25 DIAGNOSIS — Z862 Personal history of diseases of the blood and blood-forming organs and certain disorders involving the immune mechanism: Secondary | ICD-10-CM | POA: Insufficient documentation

## 2013-06-25 DIAGNOSIS — R0789 Other chest pain: Secondary | ICD-10-CM

## 2013-06-25 DIAGNOSIS — R071 Chest pain on breathing: Secondary | ICD-10-CM | POA: Insufficient documentation

## 2013-06-25 DIAGNOSIS — Z79899 Other long term (current) drug therapy: Secondary | ICD-10-CM | POA: Insufficient documentation

## 2013-06-25 HISTORY — DX: Thyrotoxicosis with diffuse goiter without thyrotoxic crisis or storm: E05.00

## 2013-06-25 LAB — BASIC METABOLIC PANEL
BUN: 19 mg/dL (ref 6–23)
CO2: 26 mEq/L (ref 19–32)
Calcium: 9.5 mg/dL (ref 8.4–10.5)
Chloride: 103 mEq/L (ref 96–112)
Creatinine, Ser: 1.11 mg/dL (ref 0.50–1.35)
GFR calc Af Amer: >90 mL/min (ref >90)
GFR calc non Af Amer: 80 mL/min — ABNORMAL LOW (ref >90)
Glucose, Bld: 98 mg/dL (ref 70–99)
Potassium: 5.2 mEq/L (ref 3.7–5.3)
Sodium: 140 mEq/L (ref 137–147)

## 2013-06-25 LAB — I-STAT TROPONIN, ED
TROPONIN I, POC: 0 ng/mL (ref 0.00–0.08)
Troponin i, poc: 0 ng/mL (ref 0.00–0.08)

## 2013-06-25 LAB — CBC
HCT: 45.8 % (ref 39.0–52.0)
Hemoglobin: 16.1 g/dL (ref 13.0–17.0)
MCH: 32.7 pg (ref 26.0–34.0)
MCHC: 35.2 g/dL (ref 30.0–36.0)
MCV: 92.9 fL (ref 78.0–100.0)
Platelets: 184 10*3/uL (ref 150–400)
RBC: 4.93 MIL/uL (ref 4.22–5.81)
RDW: 13 % (ref 11.5–15.5)
WBC: 8.8 10*3/uL (ref 4.0–10.5)

## 2013-06-25 MED ORDER — IBUPROFEN 800 MG PO TABS: 800.0000 mg | ORAL_TABLET | Freq: Three times a day (TID) | ORAL | Status: DC

## 2013-06-25 NOTE — ED Notes (Signed)
Pt states that he has had L shoulder pain x4 days and states that he has had this off and on x 2 years. Denies injury. Alert and oriented.

## 2013-06-25 NOTE — Discharge Instructions (Signed)
Call for a follow up appointment with a Family or Primary Care Provider.  Call a Cardiologist for further evaluation of your chest pain. Call Dr. Dion SaucierLandau for further evaluation of your shoulder and chest wall pain. Return if Symptoms worsen.   Take medication as prescribed.

## 2013-06-25 NOTE — ED Provider Notes (Signed)
CSN: [id number]     Arrival date & time 06/25/13  1553 History  This chart was scribed for non-physician practitioner, Mellody Drown, PA-C, working with Gavin Pound. Oletta Lamas, MD by Smiley Houseman, ED Scribe. This patient was seen in room WTR5/WTR5 and the patient's care was started at 4:44 PM.    Chief Complaint  Patient presents with  . Shoulder Pain   HPI Comments: Earl Williams is a 43 y.o. male who presents to the Emergency Department complaining of constant worsening left sided chest wall pain that started about 4 days.  Pt reports intermittent discomfort for about 2 years.  The patient reports pain increase with left shoulder movement and palpation. He reports occasional radiation into upper back over the past 2 years, denies current radiation.  Pt states he has visited someone for this pain but never referred him to a specialist.  He states with previous episodes he was concerned about his heart, but he currently denies chest pain and SOB.  Reports early MI in mother.  Denies history of murmur, previous MI, arrythmia.  The history is provided by the patient. No language interpreter was used.    Past Medical History  Diagnosis Date  . HLD (hyperlipidemia)   . HTN (hypertension)   . Hemophilia   . OSA on CPAP   . Dyspnea   . Graves' disease    Past Surgical History  Procedure Laterality Date  . Tonsillectomy     History reviewed. No pertinent family history. History  Substance Use Topics  . Smoking status: Never Smoker   . Smokeless tobacco: Not on file  . Alcohol Use: No    Review of Systems  Constitutional: Negative for fever and chills.  Respiratory: Negative for cough and shortness of breath.   Cardiovascular: Negative for chest pain and leg swelling.  Gastrointestinal: Negative for nausea, vomiting, abdominal pain and diarrhea.  Musculoskeletal: Positive for arthralgias (Left shoulder). Negative for back pain, joint swelling, myalgias, neck pain and neck  stiffness.  Skin: Negative for color change and rash.  Psychiatric/Behavioral: Negative for behavioral problems and confusion.   Allergies  Aspirin and Dilaudid  Home Medications   Current Outpatient Rx  Name  Route  Sig  Dispense  Refill  . lisinopril (PRINIVIL,ZESTRIL) 10 MG tablet   Oral   Take 15 mg by mouth daily.         . pravastatin (PRAVACHOL) 20 MG tablet   Oral   Take 20 mg by mouth daily.          Triage Vitals: BP 155/81  Pulse 76  Temp(Src) 98.2 F (36.8 C) (Oral)  Resp 18  SpO2 97%  Physical Exam  Nursing note and vitals reviewed. Constitutional: He is oriented to person, place, and time. He appears well-developed and well-nourished. No distress.  HENT:  Head: Normocephalic and atraumatic.  Eyes: Conjunctivae and EOM are normal. Right eye exhibits no discharge. Left eye exhibits no discharge.  Neck: Neck supple. No tracheal deviation present.  Cardiovascular: Normal rate, regular rhythm, normal heart sounds and intact distal pulses.  Exam reveals no gallop and no friction rub.   No murmur heard. Pulmonary/Chest: Effort normal and breath sounds normal. No respiratory distress. He has no decreased breath sounds. He has no wheezes. He has no rhonchi. He has no rales. He exhibits tenderness. He exhibits no crepitus and no deformity.    Patient is able to speak in complete sentences. No rash identified on chest wall.  Abdominal: Soft. He exhibits  no distension.  Musculoskeletal: Normal range of motion.       Left shoulder: He exhibits tenderness and crepitus. He exhibits normal range of motion, no bony tenderness, no swelling, no effusion, no laceration, no spasm, normal pulse and normal strength.  Crepitus with abduction of left shoulder.   No midline C-spine, T-spine, or L-spine tenderness with no step-offs, crepitus, or deformities noted. No paravertebral tenderness to palpation.   Neurological: He is alert and oriented to person, place, and time.  Skin:  Skin is warm and dry. No rash noted.  Psychiatric: He has a normal mood and affect. His behavior is normal. Judgment and thought content normal.    ED Course  Procedures (including critical care time)  COORDINATION OF CARE: 4:50 PM-Will order chest x-ray.  Patient informed of current plan of treatment and evaluation and agrees with plan.    5:41 PM - Informed pt his x-rays showed normal results.  Will order shoulder sling.  Results for orders placed during the hospital encounter of 06/25/13  CBC      Result Value Ref Range   WBC 8.8  4.0 - 10.5 K/uL   RBC 4.93  4.22 - 5.81 MIL/uL   Hemoglobin 16.1  13.0 - 17.0 g/dL   HCT 65.745.8  84.639.0 - 96.252.0 %   MCV 92.9  78.0 - 100.0 fL   MCH 32.7  26.0 - 34.0 pg   MCHC 35.2  30.0 - 36.0 g/dL   RDW 95.213.0  84.111.5 - 32.415.5 %   Platelets 184  150 - 400 K/uL  BASIC METABOLIC PANEL      Result Value Ref Range   Sodium 140  137 - 147 mEq/L   Potassium 5.2  3.7 - 5.3 mEq/L   Chloride 103  96 - 112 mEq/L   CO2 26  19 - 32 mEq/L   Glucose, Bld 98  70 - 99 mg/dL   BUN 19  6 - 23 mg/dL   Creatinine, Ser 4.011.11  0.50 - 1.35 mg/dL   Calcium 9.5  8.4 - 02.710.5 mg/dL   GFR calc non Af Amer 80 (*) >90 mL/min   GFR calc Af Amer >90  >90 mL/min  I-STAT TROPOININ, ED      Result Value Ref Range   Troponin i, poc 0.00  0.00 - 0.08 ng/mL   Comment 3           I-STAT TROPOININ, ED      Result Value Ref Range   Troponin i, poc 0.00  0.00 - 0.08 ng/mL   Comment 3            Dg Chest 2 View  06/25/2013   CLINICAL DATA:  Left arm pain for 3-4 days  EXAM: CHEST  2 VIEW  COMPARISON:  DG CHEST 2 VIEW dated 03/15/2011  FINDINGS: The heart size and mediastinal contours are within normal limits. Both lungs are clear. The visualized skeletal structures are unremarkable.  IMPRESSION: No active cardiopulmonary disease.   Electronically Signed   By: Christiana PellantGretchen  Green M.D.   On: 06/25/2013 17:31   Dg Shoulder Left  06/25/2013   CLINICAL DATA:  Left shoulder pain for 4 days  EXAM: LEFT  SHOULDER - 2+ VIEW  COMPARISON:  None.  FINDINGS: There is no evidence of fracture or dislocation. There is no evidence of arthropathy or other focal bone abnormality. Soft tissues are unremarkable.  IMPRESSION: Negative.   Electronically Signed   By: Christiana PellantGretchen  Green M.D.   On:  06/25/2013 16:56     MDM    Final diagnoses:  Chest wall pain  Shoulder pain   Pt with reproducible left shoulder pain and left sided anterior wall chest pain. XR without acute findings. 1745Re-eval: Discussed negative chest XR and shoulder with the patient and the patient's mother.  Pt states he is worried about the chest wall discomfort being cardiac related, because his mother gave him a Nitro and the pain subsided.  Discussed at length that relief with nitro does not indicate cardiac in nature. He denies history of MI, denies chest pain currently. Will order EKG and troponin level. EKG without acute findings, troponin negative, cbc and bmp negative. Discussed patient history, condition, and labs with Dr. Radford Pax, agrees the patient is a HEART score low risk given risk factors. Advises second troponin and follow up with cardiology if negative. Negative second troponin. Discussed lab results, imaging results, and treatment plan with the patient. Return precautions given. Reports understanding and no other concerns at this time.  Patient is stable for discharge at this time.  Meds given in ED:  Medications - No data to display  Discharge Medication List as of 06/25/2013  8:31 PM    Ibuprofen 800 mg every 8 hours for chest wall pain.  Take with food.  I personally performed the services described in this documentation, which was scribed in my presence. The recorded information has been reviewed and is accurate.      Clabe Seal, PA-C 06/27/13 [phone]

## 2013-06-29 NOTE — ED Provider Notes (Signed)
Medical screening examination/treatment/procedure(s) were performed by non-physician practitioner and as supervising physician I was immediately available for consultation/collaboration.    Kina Shiffman L Julianna Vanwagner, MD 06/29/13 1531 

## 2015-04-23 ENCOUNTER — Encounter (HOSPITAL_COMMUNITY): Payer: Self-pay | Admitting: Cardiology

## 2015-04-23 ENCOUNTER — Emergency Department (HOSPITAL_COMMUNITY)
Admission: EM | Admit: 2015-04-23 | Discharge: 2015-04-23 | Disposition: A | Discharge status: Home / Self Care | Payer: BLUE CROSS/BLUE SHIELD | Attending: Emergency Medicine | Admitting: Emergency Medicine

## 2015-04-23 DIAGNOSIS — R197 Diarrhea, unspecified: Secondary | ICD-10-CM | POA: Diagnosis not present

## 2015-04-23 DIAGNOSIS — I10 Essential (primary) hypertension: Secondary | ICD-10-CM | POA: Insufficient documentation

## 2015-04-23 DIAGNOSIS — E785 Hyperlipidemia, unspecified: Secondary | ICD-10-CM | POA: Insufficient documentation

## 2015-04-23 DIAGNOSIS — Z9981 Dependence on supplemental oxygen: Secondary | ICD-10-CM | POA: Diagnosis not present

## 2015-04-23 DIAGNOSIS — R109 Unspecified abdominal pain: Secondary | ICD-10-CM | POA: Diagnosis not present

## 2015-04-23 DIAGNOSIS — Z79899 Other long term (current) drug therapy: Secondary | ICD-10-CM | POA: Diagnosis not present

## 2015-04-23 DIAGNOSIS — Z862 Personal history of diseases of the blood and blood-forming organs and certain disorders involving the immune mechanism: Secondary | ICD-10-CM | POA: Diagnosis not present

## 2015-04-23 DIAGNOSIS — G4733 Obstructive sleep apnea (adult) (pediatric): Secondary | ICD-10-CM | POA: Diagnosis not present

## 2015-04-23 DIAGNOSIS — R110 Nausea: Secondary | ICD-10-CM | POA: Insufficient documentation

## 2015-04-23 LAB — COMPREHENSIVE METABOLIC PANEL
ALT: 40 U/L (ref 17–63)
AST: 27 U/L (ref 15–41)
Albumin: 3.6 g/dL (ref 3.5–5.0)
Alkaline Phosphatase: 67 U/L (ref 38–126)
Anion gap: 11 (ref 5–15)
BILIRUBIN TOTAL: 1.1 mg/dL (ref 0.3–1.2)
BUN: 18 mg/dL (ref 6–20)
CHLORIDE: 100 mmol/L — AB (ref 101–111)
CO2: 24 mmol/L (ref 22–32)
Calcium: 8.6 mg/dL — ABNORMAL LOW (ref 8.9–10.3)
Creatinine, Ser: 1.8 mg/dL — ABNORMAL HIGH (ref 0.61–1.24)
GFR, EST AFRICAN AMERICAN: 51 mL/min — AB (ref >60)
GFR, EST NON AFRICAN AMERICAN: 44 mL/min — AB (ref >60)
Glucose, Bld: 219 mg/dL — ABNORMAL HIGH (ref 65–99)
Potassium: 3.7 mmol/L (ref 3.5–5.1)
Sodium: 135 mmol/L (ref 135–145)
Total Protein: 6.3 g/dL — ABNORMAL LOW (ref 6.5–8.1)

## 2015-04-23 LAB — URINALYSIS, ROUTINE W REFLEX MICROSCOPIC
BILIRUBIN URINE: NEGATIVE
GLUCOSE, UA: NEGATIVE mg/dL
Hgb urine dipstick: NEGATIVE
KETONES UR: NEGATIVE mg/dL
LEUKOCYTES UA: NEGATIVE
NITRITE: NEGATIVE
PROTEIN: 30 mg/dL — AB
Specific Gravity, Urine: 1.038 — ABNORMAL HIGH (ref 1.005–1.030)
pH: 5 (ref 5.0–8.0)

## 2015-04-23 LAB — CBC
HCT: 46.6 % (ref 39.0–52.0)
Hemoglobin: 16.4 g/dL (ref 13.0–17.0)
MCH: 32.2 pg (ref 26.0–34.0)
MCHC: 35.2 g/dL (ref 30.0–36.0)
MCV: 91.6 fL (ref 78.0–100.0)
PLATELETS: 157 10*3/uL (ref 150–400)
RBC: 5.09 MIL/uL (ref 4.22–5.81)
RDW: 12.9 % (ref 11.5–15.5)
WBC: 8 10*3/uL (ref 4.0–10.5)

## 2015-04-23 LAB — URINE MICROSCOPIC-ADD ON

## 2015-04-23 LAB — LIPASE, BLOOD: LIPASE: 22 U/L (ref 11–51)

## 2015-04-23 MED ORDER — ONDANSETRON HCL 4 MG/2ML IJ SOLN: 4.0000 mg | INTRAMUSCULAR | Status: DC

## 2015-04-23 MED ORDER — ONDANSETRON 4 MG PO TBDP: 4.0000 mg | ORAL_TABLET | Freq: Three times a day (TID) | ORAL | Status: DC | PRN

## 2015-04-23 MED ORDER — SODIUM CHLORIDE 0.9 % IV BOLUS (SEPSIS)
1000.0000 mL | Freq: Once | INTRAVENOUS | Status: AC
  Administered 2015-04-23: 1000 mL via INTRAVENOUS

## 2015-04-23 NOTE — Discharge Instructions (Signed)
1. Medications: zofran, usual home medications 2. Treatment: rest, drink plenty of fluids 3. Follow Up: please followup with your primary doctor for discussion of your diagnoses and further evaluation after today's visit and for recheck of your kidney function; please return to the ER for increased abdominal pain, persistent vomiting, blood in your stool, new or worsening symptoms   Abdominal Pain, Adult Many things can cause abdominal pain. Usually, abdominal pain is not caused by a disease and will improve without treatment. It can often be observed and treated at home. Your health care provider will do a physical exam and possibly order blood tests and X-rays to help determine the seriousness of your pain. However, in many cases, more time must pass before a clear cause of the pain can be found. Before that point, your health care provider may not know if you need more testing or further treatment. HOME CARE INSTRUCTIONS Monitor your abdominal pain for any changes. The following actions may help to alleviate any discomfort you are experiencing:  Only take over-the-counter or prescription medicines as directed by your health care provider.  Do not take laxatives unless directed to do so by your health care provider.  Try a clear liquid diet (broth, tea, or water) as directed by your health care provider. Slowly move to a bland diet as tolerated. SEEK MEDICAL CARE IF:  You have unexplained abdominal pain.  You have abdominal pain associated with nausea or diarrhea.  You have pain when you urinate or have a bowel movement.  You experience abdominal pain that wakes you in the night.  You have abdominal pain that is worsened or improved by eating food.  You have abdominal pain that is worsened with eating fatty foods.  You have a fever. SEEK IMMEDIATE MEDICAL CARE IF:  Your pain does not go away within 2 hours.  You keep throwing up (vomiting).  Your pain is felt only in portions of  the abdomen, such as the right side or the left lower portion of the abdomen.  You pass bloody or black tarry stools. MAKE SURE YOU:  Understand these instructions.  Will watch your condition.  Will get help right away if you are not doing well or get worse.   This information is not intended to replace advice given to you by your health care provider. Make sure you discuss any questions you have with your health care provider.   Document Released: 12/10/2004 Document Revised: 11/21/2014 Document Reviewed: 11/09/2012 Elsevier Interactive Patient Education 2016 [employer]  Diarrhea Diarrhea is frequent loose and watery bowel movements. It can cause you to feel weak and dehydrated. Dehydration can cause you to become tired and thirsty, have a dry mouth, and have decreased urination that often is dark yellow. Diarrhea is a sign of another problem, most often an infection that will not last long. In most cases, diarrhea typically lasts 2-3 days. However, it can last longer if it is a sign of something more serious. It is important to treat your diarrhea as directed by your caregiver to lessen or prevent future episodes of diarrhea. CAUSES  Some common causes include:  Gastrointestinal infections caused by viruses, bacteria, or parasites.  Food poisoning or food allergies.  Certain medicines, such as antibiotics, chemotherapy, and laxatives.  Artificial sweeteners and fructose.  Digestive disorders. HOME CARE INSTRUCTIONS  Ensure adequate fluid intake (hydration): Have 1 cup (8 oz) of fluid for each diarrhea episode. Avoid fluids that contain simple sugars or sports drinks, fruit juices,  whole milk products, and sodas. Your urine should be clear or pale yellow if you are drinking enough fluids. Hydrate with an oral rehydration solution that you can purchase at pharmacies, retail stores, and online. You can prepare an oral rehydration solution at home by mixing the following ingredients  together:   - tsp table salt.   tsp baking soda.   tsp salt substitute containing potassium chloride.  1  tablespoons sugar.  1 L (34 oz) of water.  Certain foods and beverages may increase the speed at which food moves through the gastrointestinal (GI) tract. These foods and beverages should be avoided and include:  Caffeinated and alcoholic beverages.  High-fiber foods, such as raw fruits and vegetables, nuts, seeds, and whole grain breads and cereals.  Foods and beverages sweetened with sugar alcohols, such as xylitol, sorbitol, and mannitol.  Some foods may be well tolerated and may help thicken stool including:  Starchy foods, such as rice, toast, pasta, low-sugar cereal, oatmeal, grits, baked potatoes, crackers, and bagels.  Bananas.  Applesauce.  Add probiotic-rich foods to help increase healthy bacteria in the GI tract, such as yogurt and fermented milk products.  Wash your hands well after each diarrhea episode.  Only take over-the-counter or prescription medicines as directed by your caregiver.  Take a warm bath to relieve any burning or pain from frequent diarrhea episodes. SEEK IMMEDIATE MEDICAL CARE IF:   You are unable to keep fluids down.  You have persistent vomiting.  You have blood in your stool, or your stools are black and tarry.  You do not urinate in 6-8 hours, or there is only a small amount of very dark urine.  You have abdominal pain that increases or localizes.  You have weakness, dizziness, confusion, or light-headedness.  You have a severe headache.  Your diarrhea gets worse or does not get better.  You have a fever or persistent symptoms for more than 2-3 days.  You have a fever and your symptoms suddenly get worse. MAKE SURE YOU:   Understand these instructions.  Will watch your condition.  Will get help right away if you are not doing well or get worse.   This information is not intended to replace advice given to you by  your health care provider. Make sure you discuss any questions you have with your health care provider.   Document Released: 02/20/2002 Document Revised: 03/23/2014 Document Reviewed: 11/08/2011 Elsevier Interactive Patient Education [employer]

## 2015-04-23 NOTE — ED Provider Notes (Signed)
CSN: [id number]     Arrival date & time 04/23/15  4098 History   First MD Initiated Contact with Patient 04/23/15 1030     Chief Complaint  Patient presents with  . Abdominal Pain  . Diarrhea    HPI   Earl Williams is a 45 y.o. male with a PMH of HLD, HTN, hemophilia, OSA on CPAP who presents to the ED with abdominal pain, nausea, and diarrhea, which he states started yesterday afternoon and has been intermittent since that time. He denies exacerbating factors. He has tried drinking ginger ale for symptom relief. He reports associated hot and cold flashes. He denies vomiting, hematochezia, melena, dysuria, urgency, frequency, recent antibiotic use, recent travel, unusual food consumption. At this time, patient states his abdominal pain and nausea have resolved.   Past Medical History  Diagnosis Date  . HLD (hyperlipidemia)   . HTN (hypertension)   . Hemophilia (HCC)   . OSA on CPAP   . Dyspnea   . Graves' disease    Past Surgical History  Procedure Laterality Date  . Tonsillectomy     History reviewed. No pertinent family history. Social History  Substance Use Topics  . Smoking status: Never Smoker   . Smokeless tobacco: None  . Alcohol Use: No      Review of Systems  Constitutional: Negative for fever and chills.  Gastrointestinal: Positive for nausea, abdominal pain and diarrhea. Negative for vomiting, constipation and blood in stool.  Genitourinary: Negative for dysuria, urgency and frequency.  All other systems reviewed and are negative.     Allergies  Aspirin and Dilaudid  Home Medications   Prior to Admission medications   Medication Sig Start Date End Date Taking? Authorizing Provider  acetaminophen (TYLENOL) 325 MG tablet Take 650 mg by mouth once.   Yes Historical Provider, MD  hydrochlorothiazide (MICROZIDE) 12.5 MG capsule Take 12.5 mg by mouth daily.   Yes Historical Provider, MD  ibuprofen (ADVIL,MOTRIN) 800 MG tablet Take 1 tablet (800  mg total) by mouth 3 (three) times daily. Take with meals Patient not taking: Reported on 04/23/2015 06/25/13   Mellody Drown, PA-C  lisinopril (PRINIVIL,ZESTRIL) 10 MG tablet Take 15 mg by mouth daily.    Historical Provider, MD  ondansetron (ZOFRAN ODT) 4 MG disintegrating tablet Take 1 tablet (4 mg total) by mouth every 8 (eight) hours as needed for nausea. 04/23/15   [name], PA-C  pravastatin (PRAVACHOL) 20 MG tablet Take 20 mg by mouth daily.    Historical Provider, MD    BP 145/94 mmHg  Pulse 71  Temp(Src) 97.6 F (36.4 C) (Oral)  Resp 18  Ht  (1.676 m)  Wt 113.853 kg  BMI 40.53 kg/m2  SpO2 95% Physical Exam  Constitutional: He is oriented to person, place, and time. He appears well-developed and well-nourished. No distress.  HENT:  Head: Normocephalic and atraumatic.  Right Ear: External ear normal.  Left Ear: External ear normal.  Nose: Nose normal.  Mouth/Throat: Uvula is midline, oropharynx is clear and moist and mucous membranes are normal.  Eyes: Conjunctivae, EOM and lids are normal. Pupils are equal, round, and reactive to light. Right eye exhibits no discharge. Left eye exhibits no discharge. No scleral icterus.  Neck: Normal range of motion. Neck supple.  Cardiovascular: Normal rate, regular rhythm, normal heart sounds, intact distal pulses and normal pulses.   [id number]: Effort normal and breath sounds normal. No respiratory distress. He has no wheezes. He has no rales.  Abdominal: Soft. Normal appearance and bowel sounds are normal. He exhibits no distension and no mass. There is no tenderness. There is no rigidity, no rebound and no guarding.  Abdomen soft, non-tender, non-distended.  Musculoskeletal: Normal range of motion. He exhibits no edema or tenderness.  Neurological: He is alert and oriented to person, place, and time.  Skin: Skin is warm, dry and intact. No rash noted. He is not diaphoretic. No erythema. No pallor.  Psychiatric: He has  a normal mood and affect. His speech is normal and behavior is normal.  Nursing note and vitals reviewed.   ED Course  Procedures (including critical care time)  Labs Review Labs Reviewed  COMPREHENSIVE METABOLIC PANEL - Abnormal; Notable for the following:    Chloride 100 (*)    Glucose, Bld 219 (*)    Creatinine, Ser 1.80 (*)    Calcium 8.6 (*)    Total Protein 6.3 (*)    GFR calc non Af Amer 44 (*)    GFR calc Af Amer 51 (*)    All other components within normal limits  URINALYSIS, ROUTINE W REFLEX MICROSCOPIC (NOT AT [facility]) - Abnormal; Notable for the following:    Color, Urine AMBER (*)    Specific Gravity, Urine 1.038 (*)    Protein, ur 30 (*)    All other components within normal limits  URINE MICROSCOPIC-ADD ON - Abnormal; Notable for the following:    Squamous Epithelial / LPF 0-5 (*)    Bacteria, UA RARE (*)    All other components within normal limits  URINE CULTURE  LIPASE, BLOOD  CBC    Imaging Review No results found.   I have personally reviewed and evaluated these images and lab results as part of my medical decision-making.   EKG Interpretation None      MDM   Final diagnoses:  Abdominal pain, unspecified abdominal location  Nausea  Diarrhea, unspecified type    45 year old male presents with abdominal pain, nausea, and diarrhea. Reports abdominal pain and nausea have resolved, though reports persistent diarrhea. Denies hematochezia or melena. Patient is afebrile. Hypertensive. Abdomen soft, nontender, nondistended. No rebound, guarding, or masses. Will give fluids. Labs pending.  CBC negative for leukocytosis or anemia. CMP remarkable for creatinine 1.8, which appears elevated from baseline. Lipase within normal limits. UA negative for infection.  On reassessment of patient, he complains of nausea. Patient requesting ginger ale and saltine crackers. Patient able to tolerate PO intake and reports improvement in symptoms s/p ginger ale and saltine  crackers. Patient declines nausea medication in the ED. Patient is non-toxic and well-appearing, feel he is stable for discharge at this time. Abdominal exam benign. BP improved to 145/94. Patient to follow-up with PCP for recheck of creatinine. Will prescribe zofran for home. Return precautions discussed. Patient verbalizes his understanding and is in agreement with plan.   BP 145/94 mmHg  Pulse 71  Temp(Src) 97.6 F (36.4 C) (Oral)  Resp 18  Ht  (1.676 m)  Wt 113.853 kg  BMI 40.53 kg/m2  SpO2 95%     [name], PA-C 04/23/15 1223  Raeford Razor, MD 04/23/15 1327

## 2015-04-23 NOTE — ED Notes (Signed)
Reports generalized abd pain with diarrhea since yesterday. Also having hot flashes.

## 2015-04-24 LAB — URINE CULTURE: Culture: NO GROWTH

## 2016-09-19 ENCOUNTER — Encounter (HOSPITAL_COMMUNITY): Payer: Self-pay | Admitting: Emergency Medicine

## 2016-09-19 ENCOUNTER — Inpatient Hospital Stay (HOSPITAL_COMMUNITY)
Admission: EM | Admit: 2016-09-19 | Discharge: 2016-09-21 | DRG: 304 | Disposition: A | Discharge status: Home / Self Care | Payer: Self-pay | Attending: Internal Medicine | Admitting: Internal Medicine

## 2016-09-19 ENCOUNTER — Emergency Department (HOSPITAL_COMMUNITY): Payer: Self-pay

## 2016-09-19 DIAGNOSIS — I160 Hypertensive urgency: Principal | ICD-10-CM | POA: Diagnosis present

## 2016-09-19 DIAGNOSIS — E0500 Thyrotoxicosis with diffuse goiter without thyrotoxic crisis or storm: Secondary | ICD-10-CM | POA: Diagnosis present

## 2016-09-19 DIAGNOSIS — N183 Chronic kidney disease, stage 3 (moderate): Secondary | ICD-10-CM | POA: Diagnosis present

## 2016-09-19 DIAGNOSIS — Z9114 Patient's other noncompliance with medication regimen: Secondary | ICD-10-CM

## 2016-09-19 DIAGNOSIS — R0789 Other chest pain: Secondary | ICD-10-CM | POA: Diagnosis present

## 2016-09-19 DIAGNOSIS — E785 Hyperlipidemia, unspecified: Secondary | ICD-10-CM | POA: Diagnosis present

## 2016-09-19 DIAGNOSIS — I7121 Aneurysm of the ascending aorta, without rupture: Secondary | ICD-10-CM

## 2016-09-19 DIAGNOSIS — Z6837 Body mass index (BMI) 37.0-37.9, adult: Secondary | ICD-10-CM

## 2016-09-19 DIAGNOSIS — I712 Thoracic aortic aneurysm, without rupture: Secondary | ICD-10-CM | POA: Diagnosis present

## 2016-09-19 DIAGNOSIS — I1310 Hypertensive heart and chronic kidney disease without heart failure, with stage 1 through stage 4 chronic kidney disease, or unspecified chronic kidney disease: Secondary | ICD-10-CM | POA: Diagnosis present

## 2016-09-19 DIAGNOSIS — E669 Obesity, unspecified: Secondary | ICD-10-CM | POA: Diagnosis present

## 2016-09-19 DIAGNOSIS — N182 Chronic kidney disease, stage 2 (mild): Secondary | ICD-10-CM | POA: Diagnosis present

## 2016-09-19 DIAGNOSIS — G4733 Obstructive sleep apnea (adult) (pediatric): Secondary | ICD-10-CM | POA: Diagnosis present

## 2016-09-19 DIAGNOSIS — R079 Chest pain, unspecified: Secondary | ICD-10-CM | POA: Diagnosis present

## 2016-09-19 DIAGNOSIS — Z79899 Other long term (current) drug therapy: Secondary | ICD-10-CM

## 2016-09-19 DIAGNOSIS — D66 Hereditary factor VIII deficiency: Secondary | ICD-10-CM | POA: Diagnosis present

## 2016-09-19 LAB — BASIC METABOLIC PANEL
Anion gap: 8 (ref 5–15)
BUN: 18 mg/dL (ref 6–20)
CO2: 27 mmol/L (ref 22–32)
CREATININE: 1.79 mg/dL — AB (ref 0.61–1.24)
Calcium: 9.2 mg/dL (ref 8.9–10.3)
Chloride: 103 mmol/L (ref 101–111)
GFR calc Af Amer: 51 mL/min — ABNORMAL LOW (ref >60)
GFR, EST NON AFRICAN AMERICAN: 44 mL/min — AB (ref >60)
Glucose, Bld: 130 mg/dL — ABNORMAL HIGH (ref 65–99)
POTASSIUM: 3.6 mmol/L (ref 3.5–5.1)
Sodium: 138 mmol/L (ref 135–145)

## 2016-09-19 LAB — I-STAT TROPONIN, ED: TROPONIN I, POC: 0.05 ng/mL (ref 0.00–0.08)

## 2016-09-19 LAB — CBC
HEMATOCRIT: 44.4 % (ref 39.0–52.0)
Hemoglobin: 15.9 g/dL (ref 13.0–17.0)
MCH: 32 pg (ref 26.0–34.0)
MCHC: 35.8 g/dL (ref 30.0–36.0)
MCV: 89.3 fL (ref 78.0–100.0)
PLATELETS: 204 10*3/uL (ref 150–400)
RBC: 4.97 MIL/uL (ref 4.22–5.81)
RDW: 12.9 % (ref 11.5–15.5)
WBC: 11.8 10*3/uL — AB (ref 4.0–10.5)

## 2016-09-19 MED ORDER — METOPROLOL TARTRATE 5 MG/5ML IV SOLN
5.0000 mg | Freq: Once | INTRAVENOUS | Status: AC
  Administered 2016-09-19: 5 mg via INTRAVENOUS
  Filled 2016-09-19: qty 5

## 2016-09-19 MED ORDER — NITROGLYCERIN 2 % TD OINT
1.0000 [in_us] | TOPICAL_OINTMENT | Freq: Once | TRANSDERMAL | Status: AC
  Administered 2016-09-19: 1 [in_us] via TOPICAL
  Filled 2016-09-19: qty 1

## 2016-09-19 NOTE — ED Notes (Signed)
Dr. Loren Raceravid Yelverton notified of pt.'s blood pressures.

## 2016-09-19 NOTE — ED Triage Notes (Signed)
Patient is complaining of chest pain on and off since Thursday. Patient states that he has tried other methods than taken his blood pressure medication. He is taken apple cider vinegar. Patient states that it was working until he ran out.

## 2016-09-19 NOTE — ED Provider Notes (Signed)
[facility]-EMERGENCY DEPT Provider Note   CSN: [id number] Arrival date & time: 09/19/16  1934     History   Chief Complaint Chief Complaint  Patient presents with  . Chest Pain    HPI Elana AlmChristopher Bernard Rayl is a 46 y.o. male.  Patient with history of HTN, noncompliance with blood pressure medications, hemophilia, HLD, OSA, Graves' Disease, presents with chest pain x 2 days associated with SOB. Pain has been intermittent without specific modifying factors. No nausea, vomiting or diaphoresis. No pain currently. He denies history of heart problems.    The history is provided by the patient. No language interpreter was used.  Chest Pain   Pertinent negatives include no abdominal pain, no cough, no diaphoresis, no fever, no nausea, no shortness of breath and no vomiting.    Past Medical History:  Diagnosis Date  . Dyspnea   . Graves' disease   . Hemophilia (HCC)   . HLD (hyperlipidemia)   . HTN (hypertension)   . OSA on CPAP     Patient Active Problem List   Diagnosis Date Noted  . Pneumonia 03/15/2011  . Hemophilia (HCC) 03/15/2011  . PVC (premature ventricular contraction) 08/14/2010  . DYSPNEA 10/25/2009  . 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 HYPERLIPIDEMIA 05/04/2008  . HYPERTENSION 05/04/2008  . Obstructive sleep apnea 04/13/2008    Past Surgical History:  Procedure Laterality Date  . TONSILLECTOMY         Home Medications    Prior to Admission medications   Medication Sig Start Date End Date Taking? Authorizing Provider  CALCIUM PO Take 2 tablets by mouth daily.   Yes [provider]  CYANOCOBALAMIN PO Take 2 tablets by mouth daily.   Yes [provider]  MAGNESIUM PO Take 2 tablets by mouth daily.   Yes [provider]  ibuprofen (ADVIL,MOTRIN) 800 MG tablet Take 1 tablet (800 mg total) by mouth 3 (three) times daily. Take with meals Patient not taking: Reported on 04/23/2015 06/25/13   Mellody DrownParker, Lauren, PA-C  lisinopril  (PRINIVIL,ZESTRIL) 10 MG tablet Take 15 mg by mouth daily.    [provider]  ondansetron (ZOFRAN ODT) 4 MG disintegrating tablet Take 1 tablet (4 mg total) by mouth every 8 (eight) hours as needed for nausea. Patient not taking: Reported on 09/19/2016 04/23/15   Doran StablerWestfall, Elizabeth C, PA-C    Family History History reviewed. No pertinent family history.  Social History Social History  Substance Use Topics  . Smoking status: Never Smoker  . Smokeless tobacco: Never Used  . Alcohol use No     Allergies   Aspirin and Dilaudid [hydromorphone hcl]   Review of Systems Review of Systems  Constitutional: Negative for chills, diaphoresis and fever.  HENT: Negative.   Respiratory: Negative.  Negative for cough and shortness of breath.   Cardiovascular: Positive for chest pain.  Gastrointestinal: Negative.  Negative for abdominal pain, nausea and vomiting.  Musculoskeletal: Negative.   Skin: Negative.   Neurological: Negative.      Physical Exam Updated Vital Signs BP (!) 189/113   Pulse 80   Temp 98.5 F (36.9 C) (Oral)   Resp (!) 21   Ht 5\' 6"  (1.676 m)   Wt 111.1 kg (245 lb)   SpO2 97%   BMI 39.54 kg/m   Physical Exam  Constitutional: He is oriented to person, place, and time. He appears well-developed and well-nourished.  HENT:  Head: Normocephalic.  Neck: Normal range of motion. Neck supple.  Cardiovascular: Normal rate  and regular rhythm.   No murmur heard. Pulmonary/Chest: Effort normal and breath sounds normal. He has no wheezes. He has no rales. He exhibits no tenderness.  Abdominal: Soft. Bowel sounds are normal. There is no tenderness. There is no rebound and no guarding.  Musculoskeletal: Normal range of motion. He exhibits no edema.  Neurological: He is alert and oriented to person, place, and time.  Skin: Skin is warm and dry. No rash noted.  Psychiatric: He has a normal mood and affect.     ED Treatments / Results  Labs (all labs ordered are  listed, but only abnormal results are displayed) Labs Reviewed  BASIC METABOLIC PANEL - Abnormal; Notable for the following:       Result Value   Glucose, Bld 130 (*)    Creatinine, Ser 1.79 (*)    GFR calc non Af Amer 44 (*)    GFR calc Af Amer 51 (*)    All other components within normal limits  CBC - Abnormal; Notable for the following:    WBC 11.8 (*)    All other components within normal limits  I-STAT TROPOININ, ED    EKG  EKG Interpretation None       Radiology Dg Chest 2 View  Result Date: 09/19/2016 CLINICAL DATA:  46 y/o M; left-sided chest pain worsening over the past week. EXAM: CHEST  2 VIEW COMPARISON:  06/25/2013 chest radiograph. FINDINGS: Stable heart size and mediastinal contours are within normal limits. Both lungs are clear. The visualized skeletal structures are unremarkable. IMPRESSION: No acute pulmonary process identified. Electronically Signed   By: Mitzi Hansen M.D.   On: 09/19/2016 20:06    Procedures Procedures (including critical care time)  Medications Ordered in ED Medications - No data to display   Initial Impression / Assessment and Plan / ED Course  I have reviewed the triage vital signs and the nursing notes.  Pertinent labs & imaging results that were available during my care of the patient were reviewed by me and considered in my medical decision making (see chart for details).     Patient here for evaluation of intermittent chest pain with SOB x 2 days. He reports not taking his blood pressure medications "because they didn't work." He cannot remember how long he has been off of the medication.   EKG has new ST elevation in V1, old elevations in V2, V3. Repeat EKG pending. No pain currently. Normal initial troponin. He is significantly hypertensive.  Patient discussed with Dr. Donnald Garre who has been in to evaluate the patient. He has a heart score of 4. Feel admission is appropriate for further evaluation of chest pain and  EKG change, as well as blood pressure control.   Discussed with cardiology regarding EKG changes. Per their review, no acute changes. TR[facility] paged for admission.  Final Clinical Impressions(s) / ED Diagnoses   Final diagnoses:  None   1. Chest pain 2. Hypertensive urgency  New Prescriptions New Prescriptions   No medications on file     Danne Harbor 09/20/16 Claiborne Billings, MD 09/20/16 Unk Pinto, MD 09/20/16 [phone]

## 2016-09-19 NOTE — ED Notes (Signed)
Pt. Denied chest pain at this time but generalized pain at 8/10.

## 2016-09-19 NOTE — ED Notes (Signed)
Notified EDP,Pheiffer,MD., pt. I-stat troponin results 0.05 and RN,Lilibeth made aware.

## 2016-09-20 ENCOUNTER — Encounter (HOSPITAL_COMMUNITY): Payer: Self-pay | Admitting: Internal Medicine

## 2016-09-20 ENCOUNTER — Observation Stay (HOSPITAL_COMMUNITY): Payer: Self-pay

## 2016-09-20 DIAGNOSIS — I7121 Aneurysm of the ascending aorta, without rupture: Secondary | ICD-10-CM

## 2016-09-20 DIAGNOSIS — R079 Chest pain, unspecified: Secondary | ICD-10-CM

## 2016-09-20 DIAGNOSIS — I160 Hypertensive urgency: Secondary | ICD-10-CM | POA: Diagnosis not present

## 2016-09-20 DIAGNOSIS — E669 Obesity, unspecified: Secondary | ICD-10-CM

## 2016-09-20 DIAGNOSIS — I712 Thoracic aortic aneurysm, without rupture: Secondary | ICD-10-CM

## 2016-09-20 DIAGNOSIS — R072 Precordial pain: Secondary | ICD-10-CM

## 2016-09-20 DIAGNOSIS — N182 Chronic kidney disease, stage 2 (mild): Secondary | ICD-10-CM | POA: Diagnosis present

## 2016-09-20 LAB — BASIC METABOLIC PANEL
Anion gap: 8 (ref 5–15)
BUN: 17 mg/dL (ref 6–20)
CALCIUM: 8.7 mg/dL — AB (ref 8.9–10.3)
CO2: 25 mmol/L (ref 22–32)
Chloride: 104 mmol/L (ref 101–111)
Creatinine, Ser: 1.65 mg/dL — ABNORMAL HIGH (ref 0.61–1.24)
GFR calc Af Amer: 56 mL/min — ABNORMAL LOW (ref >60)
GFR, EST NON AFRICAN AMERICAN: 48 mL/min — AB (ref >60)
Glucose, Bld: 144 mg/dL — ABNORMAL HIGH (ref 65–99)
POTASSIUM: 3.5 mmol/L (ref 3.5–5.1)
SODIUM: 137 mmol/L (ref 135–145)

## 2016-09-20 LAB — TROPONIN I
TROPONIN I: 0.04 ng/mL — AB (ref <0.03)
TROPONIN I: 0.04 ng/mL — AB (ref <0.03)
Troponin I: 0.05 ng/mL (ref <0.03)
Troponin I: 0.05 ng/mL (ref <0.03)

## 2016-09-20 LAB — RAPID URINE DRUG SCREEN, HOSP PERFORMED
Amphetamines: NOT DETECTED
BENZODIAZEPINES: NOT DETECTED
Barbiturates: NOT DETECTED
COCAINE: NOT DETECTED
Opiates: NOT DETECTED
Tetrahydrocannabinol: NOT DETECTED

## 2016-09-20 LAB — URINALYSIS, ROUTINE W REFLEX MICROSCOPIC
BILIRUBIN URINE: NEGATIVE
Bacteria, UA: NONE SEEN
GLUCOSE, UA: NEGATIVE mg/dL
HGB URINE DIPSTICK: NEGATIVE
KETONES UR: NEGATIVE mg/dL
LEUKOCYTES UA: NEGATIVE
Nitrite: NEGATIVE
PH: 7 (ref 5.0–8.0)
Protein, ur: 100 mg/dL — AB
Specific Gravity, Urine: 1.011 (ref 1.005–1.030)
Squamous Epithelial / LPF: NONE SEEN

## 2016-09-20 LAB — I-STAT TROPONIN, ED: Troponin i, poc: 0.04 ng/mL (ref 0.00–0.08)

## 2016-09-20 LAB — ECHOCARDIOGRAM COMPLETE
Height: 66 in
WEIGHTICAEL: 3744 [oz_av]

## 2016-09-20 MED ORDER — HYDRALAZINE HCL 20 MG/ML IJ SOLN
10.0000 mg | INTRAMUSCULAR | Status: DC | PRN
  Administered 2016-09-20: 10 mg via INTRAVENOUS
  Filled 2016-09-20: qty 1

## 2016-09-20 MED ORDER — ISOSORB DINITRATE-HYDRALAZINE 20-37.5 MG PO TABS
1.0000 | ORAL_TABLET | Freq: Three times a day (TID) | ORAL | Status: DC
  Administered 2016-09-20 – 2016-09-21 (×4): 1 via ORAL
  Filled 2016-09-20 (×5): qty 1

## 2016-09-20 MED ORDER — FUROSEMIDE 40 MG PO TABS
40.0000 mg | ORAL_TABLET | Freq: Every day | ORAL | Status: DC
  Administered 2016-09-20 – 2016-09-21 (×2): 40 mg via ORAL
  Filled 2016-09-20 (×2): qty 1

## 2016-09-20 MED ORDER — AMLODIPINE BESYLATE 5 MG PO TABS
5.0000 mg | ORAL_TABLET | Freq: Every day | ORAL | Status: DC
Start: 2016-09-20 — End: 2016-09-21
  Administered 2016-09-20 – 2016-09-21 (×2): 5 mg via ORAL
  Filled 2016-09-20 (×2): qty 1

## 2016-09-20 MED ORDER — CARVEDILOL 12.5 MG PO TABS
12.5000 mg | ORAL_TABLET | Freq: Two times a day (BID) | ORAL | Status: DC
  Administered 2016-09-20 – 2016-09-21 (×3): 12.5 mg via ORAL
  Filled 2016-09-20 (×3): qty 1

## 2016-09-20 MED ORDER — METOPROLOL TARTRATE 25 MG PO TABS
12.5000 mg | ORAL_TABLET | Freq: Two times a day (BID) | ORAL | Status: DC
  Administered 2016-09-20: 12.5 mg via ORAL
  Filled 2016-09-20: qty 1

## 2016-09-20 MED ORDER — ACETAMINOPHEN 325 MG PO TABS
650.0000 mg | ORAL_TABLET | ORAL | Status: DC | PRN
  Administered 2016-09-20 – 2016-09-21 (×2): 650 mg via ORAL
  Filled 2016-09-20 (×2): qty 2

## 2016-09-20 MED ORDER — ONDANSETRON HCL 4 MG/2ML IJ SOLN: 4.0000 mg | Freq: Four times a day (QID) | INTRAMUSCULAR | Status: DC | PRN

## 2016-09-20 NOTE — Consult Note (Signed)
Cardiology Consult Note  Admit date: 09/19/2016 Name: Earl Williams 46 y.o.  male DOB:  06/13/1970 MRN:  [id number]  Today's date:  09/20/2016  Referring Physician:    [facility]  Primary cardiologist:   Dr. Winifred OlivePaul Kirkman  Reason for Consultation:   Chest pain, accelerated hypertension  IMPRESSIONS: 1.  Atypical chest pain and accelerated hypertension with trivial troponin elevation.  This does not sound ischemic in origin and and would control blood pressure and continue to monitor 2.  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  Ascending aortic aneurysm 4.  Hemophilia A 5.  Obstructive sleep apnea currently not well controlled 6.  Hyperlipidemia 7.  Obesity 8.  History of Graves' disease 9.  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  He needs control of blood pressure.  He had stopped taking all of his antihypertensives and as I discussed with him he will need probably to take 3-4 medicines for control of hypertension.  A goal blood pressure would be 120/80. 2.  I would control blood pressure and have him follow-up with his primary cardiologist regarding outpatient stress testing or nuclear testing.  An alternative way to evaluate him would be with a coronary CTA but would want to be sure that his renal function is stable.  His hemophilia A places him at increased risk of complications with arterial intervention or stenting..  I think the chest pain is atypical 3.  He has a dilated aorta and is at risk for expansion in the future with uncontrolled blood pressure.  He will need to be on a beta blocker as well as a diuretic based regimen for control of blood pressure 4.  Control of sleep apnea 5.  Stressed importance of weight reduction. 6.  I talked to him extensively about hypertension as well as the need to avoid nonsteroidal anti-inflammatory agents. 7.  Monitor renal function in  the hospital as when his blood pressure comes under better control it may worsen.  HISTORY: This 46 year old black male has a prior history long-standing history of hypertensive heart disease.  He also has hemophilia a.  [facility] and also has a history of sleep apnea which according to recent notes has not been well controlled.  He has been obese and does not get much in the way of regular exercise and eats a soft laden diet.  He presented to the emergency room last night with sharp chest pain.  The pain is described as something like something sticking him in his anterior chest and can last for 15 minutes.  The pain is described as sharp and is described as lasting less than a few minutes.  Occasionally can be pleuritic.  Not pressure like and no radiation to neck.  Mild shortness of breath associated with it.  His blood pressure has been poorly controlled recently and he quit taking all of his blood pressure medicines a few months ago and has been using apple cider ventilator.  Blood pressure was quite high when he presented last night.  Echocardiogram shows marked LVH today with moderate to marked left atrial enlargement.  He also has been seen recently by a sleep clinic and noted that his C Pap was not working well and this has been adjusted.  He denies PND or orthopnea or edema.  Works sedentary at a desk job area  Past Medical History:  Diagnosis Date  . Dyspnea   .  Graves' disease   . Hemophilia (HCC)   . HLD (hyperlipidemia)   . HTN (hypertension)   . OSA on CPAP      Past Surgical History:  Procedure Laterality Date  . TONSILLECTOMY       Allergies:  is allergic to aspirin and dilaudid [hydromorphone hcl].   Medications: Prior to Admission medications   Medication Sig Start Date End Date Taking? Authorizing Provider  CALCIUM PO Take 2 tablets by mouth daily.   Yes [provider]  CYANOCOBALAMIN PO Take 2 tablets by mouth daily.   Yes [provider]  MAGNESIUM PO Take 2 tablets by mouth daily.   Yes [provider]  ibuprofen (ADVIL,MOTRIN) 800 MG tablet Take 1 tablet (800 mg total) by mouth 3 (three) times daily. Take with meals Patient not taking: Reported on 04/23/2015 06/25/13   Mellody Drown, PA-C  lisinopril (PRINIVIL,ZESTRIL) 10 MG tablet Take 15 mg by mouth daily.    [provider]  ondansetron (ZOFRAN ODT) 4 MG disintegrating tablet Take 1 tablet (4 mg total) by mouth every 8 (eight) hours as needed for nausea. Patient not taking: Reported on 09/19/2016 04/23/15   [name], PA-C    Family History: No premature family history of cardiac disease  Social History:   reports that he has never smoked. He has never used smokeless tobacco. He reports that he does not drink alcohol or use drugs.   He currently lives with his mother.  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  Review of Systems: He had a recent evaluation for hematuria by urology.  He also has significant obesity as well as erectile dysfunction.  Occasional headaches.  Other than as noted above remainder of the review of systems is unremarkable.  Physical Exam: BP (!) 151/89 (BP Location: Left Arm)   Pulse 71   Temp 98 F (36.7 C) (Oral)   Resp 18   Ht 5\' 6"  (1.676 m)   Wt 106.1 kg (234 lb)   SpO2 97%   BMI 37.77 kg/m   General appearance: He is a obese black male appearing older than stated age currently in no acute distress Head: Normocephalic, without obvious abnormality, atraumatic Eyes: conjunctivae/corneas clear. PERRL, EOM's intact. Fundi not examined  Neck: no adenopathy, no carotid bruit, no JVD and supple, symmetrical, trachea midline Lungs: clear to auscultation bilaterally Heart: regular rate and rhythm, S1, S2 normal, no murmur, click, rub or gallop Abdomen: soft, non-tender; bowel sounds normal; no masses,  no organomegaly Rectal: deferred Extremities: extremities normal,  atraumatic, no cyanosis or edema Pulses: 2+ and symmetric Skin: Skin color, texture, turgor normal. No rashes or lesions Neurologic: Grossly normal Psych: Alert and oriented x 3 Labs: CBC  Recent Labs  09/19/16 1950  WBC 11.8*  RBC 4.97  HGB 15.9  HCT 44.4  PLT 204  MCV 89.3  MCH 32.0  MCHC 35.8  RDW 12.9   CMP   Recent Labs  09/19/16 1950  NA 138  K 3.6  CL 103  CO2 27  GLUCOSE 130*  BUN 18  CREATININE 1.79*  CALCIUM 9.2  GFRNONAA 44*  GFRAA 51*   Cardiac Panel (last 3 results) Troponin (Point of Care Test)  Recent Labs  09/19/16 2359  TROPIPOC 0.04   Cardiac Panel (last 3 results)  Recent Labs  09/20/16 0100  TROPONINI 0.04*    Radiology:  No acute disease  EKG: Normal sinus rhythm, T-wave inversions in  the high lateral and lateral leads possible LVH versus ischemia Independently reviewed by me  Signed:  W. Ashley Royalty MD [facility]   Cardiology Consultant  09/20/2016, 9:52 AM

## 2016-09-20 NOTE — Progress Notes (Addendum)
Echocardiogram 2D Echocardiogram has been performed. Images were evaluated by cardiology before preliminary info could be entered into the report.  Dorothey BasemanReel, Shawndrea Rutkowski M 09/20/2016, 8:49 AM

## 2016-09-20 NOTE — Progress Notes (Signed)
Patient seen and examined. He denies chest pain. Reviewed admission HPI He is feeling better.   Chest pain; cardiology consulted. Controlled BP.  HTN; start Hydralazine/imdure. CKD stage II--III; Monitor renal function.

## 2016-09-20 NOTE — H&P (Addendum)
History and Physical    Earl Williams:[id number] DOB: 1970-04-27 DOA: 09/19/2016  PCP: Fleet Contras, MD  Patient coming from: Home.  Chief Complaint: Chest pain.  HPI: Earl Williams is a 46 y.o. male with history of hypertension who has been trying alternative medications presents to the ER with complaints of chest pain. Patient has been having chest pain for last 3-4 days. Initially it was like pinpricking sensation but last 2 days became more pressure-like substernal lasting for 15-20 minutes. Chest pain gets relieved by itself. Denies any shortness of breath productive cough fever or chills.   ED Course: In the ER patient was found to have markedly elevated blood pressure and was placed on nitroglycerin patch for which blood pressure improved. EKG shows ST-T changes concerning in anterior leads. On-call cardiologist was consulted by ER physician. As per the cardiologist these are nonspecific findings. Patient is presently chest pain-free and admitted for further management of blood pressure and chest pain.  Review of Systems: As per HPI, rest all negative.   Past Medical History:  Diagnosis Date  . Dyspnea   . Graves' disease   . Hemophilia (HCC)   . HLD (hyperlipidemia)   . HTN (hypertension)   . OSA on CPAP     Past Surgical History:  Procedure Laterality Date  . TONSILLECTOMY       reports that he has never smoked. He has never used smokeless tobacco. He reports that he does not drink alcohol or use drugs.  Allergies  Allergen Reactions  . Aspirin     Pt has hemophilia   . Dilaudid [Hydromorphone Hcl]     Stomach upset.    Family History  Problem Relation Age of Onset  . Hypertension Other     Prior to Admission medications   Medication Sig Start Date End Date Taking? Authorizing Provider  CALCIUM PO Take 2 tablets by mouth daily.   Yes [provider]  CYANOCOBALAMIN PO Take 2 tablets by mouth daily.   Yes [provider]  MAGNESIUM PO Take 2 tablets by mouth daily.   Yes [provider]  ibuprofen (ADVIL,MOTRIN) 800 MG tablet Take 1 tablet (800 mg total) by mouth 3 (three) times daily. Take with meals Patient not taking: Reported on 04/23/2015 06/25/13   Mellody Drown, PA-C  lisinopril (PRINIVIL,ZESTRIL) 10 MG tablet Take 15 mg by mouth daily.    [provider]  ondansetron (ZOFRAN ODT) 4 MG disintegrating tablet Take 1 tablet (4 mg total) by mouth every 8 (eight) hours as needed for nausea. Patient not taking: Reported on 09/19/2016 04/23/15   Earl Williams, [place]    Physical Exam: Vitals:   09/19/16 2100 09/19/16 2130 09/19/16 2200 09/19/16 2230  BP: (!) 192/116 (!) 188/116 (!) 192/108 (!) 189/113  Pulse: 81 79 80 80  Resp: 15 20 (!) 23 (!) 21  Temp:      TempSrc:      SpO2: 97% 96% 99% 97%  Weight:      Height:          Constitutional: Moderately built and nourished. Vitals:   09/19/16 2100 09/19/16 2130 09/19/16 2200 09/19/16 2230  BP: (!) 192/116 (!) 188/116 (!) 192/108 (!) 189/113  Pulse: 81 79 80 80  Resp: 15 20 (!) 23 (!) 21  Temp:      TempSrc:      SpO2: 97% 96% 99% 97%  Weight:      Height:  Eyes: Anicteric no pallor. ENMT: No discharge from the ears eyes nose and mouth. Neck: No mass felt. No JVD appreciated. Respiratory: No rhonchi or crepitations. Cardiovascular: S1 and S2 heard no murmurs appreciated. Abdomen: Soft nontender bowel sounds present. Musculoskeletal: No edema. No joint effusion. Skin: No rash. Skin appears warm. Neurologic: Alert awake oriented to time place and person. Moves all extremities. Psychiatric: Appears normal. Normal affect.   Labs on Admission: I have personally reviewed following labs and imaging studies  CBC:  Recent Labs Lab 09/19/16 1950  WBC 11.8*  HGB 15.9  HCT 44.4  MCV 89.3  PLT 204   Basic Metabolic Panel:  Recent Labs Lab 09/19/16 1950  NA 138  K 3.6  CL 103  CO2 27  GLUCOSE  130*  BUN 18  CREATININE 1.79*  CALCIUM 9.2   GFR: Estimated Creatinine Clearance: 60.3 mL/min (A) (by C-G formula based on SCr of 1.79 mg/dL (H)). Liver Function Tests: No results for input(s): AST, ALT, ALKPHOS, BILITOT, PROT, ALBUMIN in the last 168 hours. No results for input(s): LIPASE, AMYLASE in the last 168 hours. No results for input(s): AMMONIA in the last 168 hours. Coagulation Profile: No results for input(s): INR, PROTIME in the last 168 hours. Cardiac Enzymes: No results for input(s): CKTOTAL, CKMB, CKMBINDEX, TROPONINI in the last 168 hours. BNP (last 3 results) No results for input(s): PROBNP in the last 8760 hours. HbA1C: No results for input(s): HGBA1C in the last 72 hours. CBG: No results for input(s): GLUCAP in the last 168 hours. Lipid Profile: No results for input(s): CHOL, HDL, LDLCALC, TRIG, CHOLHDL, LDLDIRECT in the last 72 hours. Thyroid Function Tests: No results for input(s): TSH, T4TOTAL, FREET4, T3FREE, THYROIDAB in the last 72 hours. Anemia Panel: No results for input(s): VITAMINB12, FOLATE, FERRITIN, TIBC, IRON, RETICCTPCT in the last 72 hours. Urine analysis:    Component Value Date/Time   COLORURINE AMBER (A) 04/23/2015 1109   APPEARANCEUR CLEAR 04/23/2015 1109   LABSPEC 1.038 (H) 04/23/2015 1109   PHURINE 5.0 04/23/2015 1109   GLUCOSEU NEGATIVE 04/23/2015 1109   HGBUR NEGATIVE 04/23/2015 1109   BILIRUBINUR NEGATIVE 04/23/2015 1109   KETONESUR NEGATIVE 04/23/2015 1109   PROTEINUR 30 (A) 04/23/2015 1109   UROBILINOGEN 0.2 12/12/2012 1622   NITRITE NEGATIVE 04/23/2015 1109   LEUKOCYTESUR NEGATIVE 04/23/2015 1109   Sepsis Labs: @LABRCNTIP (procalcitonin:4,lacticidven:4) )No results found for this or any previous visit (from the past 240 hour(s)).   Radiological Exams on Admission: Dg Chest 2 View  Result Date: 09/19/2016 CLINICAL DATA:  46 y/o M; left-sided chest pain worsening over the past week. EXAM: CHEST  2 VIEW COMPARISON:   06/25/2013 chest radiograph. FINDINGS: Stable heart size and mediastinal contours are within normal limits. Both lungs are clear. The visualized skeletal structures are unremarkable. IMPRESSION: No acute pulmonary process identified. Electronically Signed   By: Mitzi HansenLance  Furusawa-Stratton M.D.   On: 09/19/2016 20:06    EKG: Independently reviewed. Normal sinus rhythm with nonspecific ECG changes particularly in the anterior leads.  Assessment/Plan Principal Problem:   Chest pain Active Problems:   Hypertensive urgency   CKD (chronic kidney disease) stage 2, GFR 60-89 ml/min    1. Chest pain - we'll cycle cardiac markers keep patient on aspirin nitroglycerin patch check 2-D echo keep patient nothing by mouth in a.m. I have added metoprolol for now..  2. Hypertensive urgency - patient is on nitroglycerin patch and small dose of metoprolol has been started. When necessary hydralazine. Closely follow blood pressure trends and  patient will need further antihypertensives based on the trend. 3. History of hemophilia A. 4. Chronic kidney disease stage II to 3 - creatinine appears to be at baseline. 5. Sleep apnea on CPAP.   DVT prophylaxis: SCDs. Code Status: Full code.  Family Communication: Discussed with patient.  Disposition Plan: Home.  Consults called: None.  Admission status: Observation.    Eduard Clos MD [facility] Pager [phone].  If 7PM-7AM, please contact night-coverage [email or web address] Password [facility]  09/20/2016, 12:33 AM

## 2016-09-20 NOTE — Care Management Note (Signed)
Case Management Note  Patient Details  Name: Earl Williams MRN: [id number] Date of Birth: 12/05/1970  Subjective/Objective:                  Chest Pain  Action/Plan: CM spoke with the patient at the bedside. He states he does not need assistance with medication cost. Patient states he has insurance. Denies any other needs at this time. Encouraged to notify his nurse if he needs CM assistance prior to discharge. He agrees.   Expected Discharge Date:                  Expected Discharge Plan:  Home/Self Care  In-House Referral:     Discharge planning Services  CM Consult, Medication Assistance  Post Acute Care Choice:    Choice offered to:     DME Arranged:    DME Agency:     HH Arranged:    HH Agency:     Status of Service:  Completed, signed off  If discussed at [employer] Length of Stay Meetings, dates discussed:    Additional Comments:  Antony HasteBennett, Navayah Sok Harris, RN 09/20/2016, 8:55 AM

## 2016-09-21 DIAGNOSIS — N182 Chronic kidney disease, stage 2 (mild): Secondary | ICD-10-CM

## 2016-09-21 DIAGNOSIS — I160 Hypertensive urgency: Principal | ICD-10-CM

## 2016-09-21 LAB — BASIC METABOLIC PANEL
Anion gap: 9 (ref 5–15)
BUN: 23 mg/dL — AB (ref 6–20)
CHLORIDE: 101 mmol/L (ref 101–111)
CO2: 23 mmol/L (ref 22–32)
CREATININE: 1.95 mg/dL — AB (ref 0.61–1.24)
Calcium: 8.6 mg/dL — ABNORMAL LOW (ref 8.9–10.3)
GFR calc Af Amer: 46 mL/min — ABNORMAL LOW (ref >60)
GFR calc non Af Amer: 39 mL/min — ABNORMAL LOW (ref >60)
GLUCOSE: 153 mg/dL — AB (ref 65–99)
Potassium: 3.6 mmol/L (ref 3.5–5.1)
SODIUM: 133 mmol/L — AB (ref 135–145)

## 2016-09-21 LAB — HIV ANTIBODY (ROUTINE TESTING W REFLEX): HIV SCREEN 4TH GENERATION: NONREACTIVE

## 2016-09-21 MED ORDER — CARVEDILOL 12.5 MG PO TABS: 12.5000 mg | ORAL_TABLET | Freq: Two times a day (BID) | ORAL | 0 refills | Status: DC

## 2016-09-21 MED ORDER — HYDRALAZINE HCL 50 MG PO TABS: 50.0000 mg | ORAL_TABLET | Freq: Two times a day (BID) | ORAL | Status: DC

## 2016-09-21 MED ORDER — AMLODIPINE BESYLATE 5 MG PO TABS: 5.0000 mg | ORAL_TABLET | Freq: Every day | ORAL | 0 refills | Status: DC

## 2016-09-21 MED ORDER — FUROSEMIDE 40 MG PO TABS: 40.0000 mg | ORAL_TABLET | Freq: Every day | ORAL | 0 refills | Status: DC

## 2016-09-21 MED ORDER — HYDRALAZINE HCL 50 MG PO TABS: 50.0000 mg | ORAL_TABLET | Freq: Two times a day (BID) | ORAL | 0 refills | Status: DC

## 2016-09-21 MED ORDER — FUROSEMIDE 40 MG PO TABS: 40.0000 mg | ORAL_TABLET | Freq: Every day | ORAL | 0 refills | Status: AC

## 2016-09-21 NOTE — Progress Notes (Addendum)
The patient's mother would like the [facility] and the cardiologist to call her, her phone number is : [phone]

## 2016-09-21 NOTE — Care Management Note (Signed)
Case Management Note  Patient Details  Name: Earl Williams MRN: [id number] Date of Birth: 04/30/1970  Subjective/Objective:   46 y/o m admitted w/chest pain. From home. Patient states he has health insurance, a pcp, & a pharmacy. No CM needs.                 Action/Plan:d/c home.   Expected Discharge Date:  09/21/16               Expected Discharge Plan:  Home/Self Care  In-House Referral:     Discharge planning Services  CM Consult, Medication Assistance  Post Acute Care Choice:    Choice offered to:     DME Arranged:    DME Agency:     HH Arranged:    HH Agency:     Status of Service:  Completed, signed off  If discussed at [employer] Length [employer] Stay Meetings, dates discussed:    Additional Comments:  Lanier ClamMahabir, Abhi Moccia, RN 09/21/2016, 12:49 PM

## 2016-09-21 NOTE — Discharge Summary (Signed)
Physician Discharge Summary  Rigley Niess Garmany HAL:[id number] DOB: [birth year] DOA: 09/19/2016  PCP: Nolene Ebbs, MD  Admit date: 09/19/2016 Discharge date: 09/21/2016  Admitted From: Home  Disposition:  Home   Recommendations for Outpatient Follow-up:  Follow up with PCP in 1-2 weeks Please obtain BMP/CBC in one week Needs B-met to monitor renal function.  Needs to follow up with cardiology for consideration of stress test.  Per cardiology patient need to have follow up for AAA.    Discharge Condition:stable.  CODE STATUS: full code Diet recommendation: Heart Healthy  Brief/Interim Summary: Earl Williams is a 46 y.o. male with history of hypertension who has been trying alternative medications presents to the ER with complaints of chest pain. Patient has been having chest pain for last 3-4 days. Initially it was like pinpricking sensation but last 2 days became more pressure-like substernal lasting for 15-20 minutes. Chest pain gets relieved by itself. Denies any shortness of breath productive cough fever or chills.   ED Course: In the ER patient was found to have markedly elevated blood pressure and was placed on nitroglycerin patch for which blood pressure improved. EKG shows ST-T changes concerning in anterior leads. On-call cardiologist was consulted by ER physician. As per the cardiologist these are nonspecific findings. Patient is presently chest pain-free and admitted for further management of blood pressure and chest pain.  1-Atypical chest pain;  Resolved. Was probably related to uncontrolled HTN. Stress test out patient per cardiologist  2-mild elevation of troponin; related to HTN.   3-CKD stage;  Last cr at 1.8.  Monitor on lasix.  Needs labs at end of week. Patient aware.  Stop lisinopril.   4-Small ascending Aortic aneurysm, per cardiologist patient needs yearly Ct  5-Accelerated HTN; HTN urgency ; received IV hydralazine and nitropaste./   improved with Norvasc, hydralazine, lasix, carvedilol.     Discharge Diagnoses:  Principal Problem:   Chest pain Active Problems:   Hypertensive urgency   CKD (chronic kidney disease) stage 2, GFR 60-89 ml/min   Obesity (BMI 30-39.9)   Ascending aortic aneurysm [facility])    Discharge Instructions  Discharge Instructions    Diet - low sodium heart healthy    Complete by:  As directed    Increase activity slowly    Complete by:  As directed      Allergies as of 09/21/2016      Reactions   Aspirin    Pt has hemophilia    Dilaudid [hydromorphone Hcl]    Stomach upset.      Medication List    STOP taking these medications   ibuprofen 800 MG tablet Commonly known as:  ADVIL,MOTRIN   lisinopril 10 MG tablet Commonly known as:  PRINIVIL,ZESTRIL   ondansetron 4 MG disintegrating tablet Commonly known as:  ZOFRAN ODT     TAKE these medications   amLODipine 5 MG tablet Commonly known as:  NORVASC Take 1 tablet (5 mg total) by mouth daily. Start taking on:  09/22/2016   CALCIUM PO Take 2 tablets by mouth daily.   carvedilol 12.5 MG tablet Commonly known as:  COREG Take 1 tablet (12.5 mg total) by mouth 2 (two) times daily with a meal.   CYANOCOBALAMIN PO Take 2 tablets by mouth daily.   furosemide 40 MG tablet Commonly known as:  LASIX Take 1 tablet (40 mg total) by mouth daily. Start taking on:  09/22/2016   hydrALAZINE 50 MG tablet Commonly known as:  APRESOLINE Take 1 tablet (50 mg  total) by mouth 2 (two) times daily.   MAGNESIUM PO Take 2 tablets by mouth daily.      Follow-up Information    Avbuere, Edwin, MD Follow up in 4 day(s).   Specialty:  Internal Medicine Contact information: [address] [place] [address] [phone]        Kirkman, Paul, MD Follow up in 1 week(s).   Specialty:  Internal Medicine Contact information: [address] [place] [address] [phone]          Allergies  Allergen Reactions  .  Aspirin     Pt has hemophilia   . Dilaudid [Hydromorphone Hcl]     Stomach upset.    Consultations:  Cardiology    Procedures/Studies: Dg Chest 2 View  Result Date: 09/19/2016 CLINICAL DATA:  46 y/o M; left-sided chest pain worsening over the past week. EXAM: CHEST  2 VIEW COMPARISON:  06/25/2013 chest radiograph. FINDINGS: Stable heart size and mediastinal contours are within normal limits. Both lungs are clear. The visualized skeletal structures are unremarkable. IMPRESSION: No acute pulmonary process identified. Electronically Signed   By: Lance  Furusawa-Stratton M.D.   On: 09/19/2016 20:06       Subjective: Chest pain free. Headache better/  Feeling well, wants to go home   Discharge Exam: Vitals:   09/20/16 2255 09/21/16 0533  BP:  133/63  Pulse:  83  Resp: 18 16  Temp:  99 F (37.2 C)   Vitals:   09/20/16 2046 09/20/16 2235 09/20/16 2255 09/21/16 0533  BP: (!) 155/92 (!) 142/78  133/63  Pulse: 85 90  83  Resp: 18  18 16  Temp: 99.1 F (37.3 C)   99 F (37.2 C)  TempSrc: Oral   Oral  SpO2: 99%   97%  Weight:      Height:        General: Pt is alert, awake, not in acute distress Cardiovascular: RRR, S1/S2 +, no rubs, no gallops Respiratory: CTA bilaterally, no wheezing, no rhonchi Abdominal: Soft, NT, ND, bowel sounds + Extremities: no edema, no cyanosis    The results of significant diagnostics from this hospitalization (including imaging, microbiology, ancillary and laboratory) are listed below for reference.     Microbiology: No results found for this or any previous visit (from the past 240 hour(s)).   Labs: BNP (last 3 results) No results for input(s): BNP in the last 8760 hours. Basic Metabolic Panel:  Recent Labs Lab 09/19/16 1950 09/20/16 0908 09/21/16 0703  NA 138 137 133*  K 3.6 3.5 3.6  CL 103 104 101  CO2 27 25 23  GLUCOSE 130* 144* 153*  BUN 18 17 23*  CREATININE 1.79* 1.65* 1.95*  CALCIUM 9.2 8.7* 8.6*   Liver Function  Tests: No results for input(s): AST, ALT, ALKPHOS, BILITOT, PROT, ALBUMIN in the last 168 hours. No results for input(s): LIPASE, AMYLASE in the last 168 hours. No results for input(s): AMMONIA in the last 168 hours. CBC:  Recent Labs Lab 09/19/16 1950  WBC 11.8*  HGB 15.9  HCT 44.4  MCV 89.3  PLT 204   Cardiac Enzymes:  Recent Labs Lab 09/20/16 0100 09/20/16 0908 09/20/16 1510 09/20/16 2240  TROPONINI 0.04* 0.04* 0.05* 0.05*   BNP: Invalid input(s): POCBNP CBG: No results for input(s): GLUCAP in the last 168 hours. D-Dimer No results for input(s): DDIMER in the last 72 hours. Hgb A1c No results for input(s): HGBA1C in the last 72 hours. Lipid Profile No results for input(s):   CHOL, HDL, LDLCALC, TRIG, CHOLHDL, LDLDIRECT in the last 72 hours. Thyroid function studies No results for input(s): TSH, T4TOTAL, T3FREE, THYROIDAB in the last 72 hours.  Invalid input(s): FREET3 Anemia work up No results for input(s): VITAMINB12, FOLATE, FERRITIN, TIBC, IRON, RETICCTPCT in the last 72 hours. Urinalysis    Component Value Date/Time   COLORURINE STRAW (A) 09/20/2016 0046   APPEARANCEUR CLEAR 09/20/2016 0046   LABSPEC 1.011 09/20/2016 0046   PHURINE 7.0 09/20/2016 0046   GLUCOSEU NEGATIVE 09/20/2016 0046   HGBUR NEGATIVE 09/20/2016 0046   BILIRUBINUR NEGATIVE 09/20/2016 0046   KETONESUR NEGATIVE 09/20/2016 0046   PROTEINUR 100 (A) 09/20/2016 0046   UROBILINOGEN 0.2 12/12/2012 1622   NITRITE NEGATIVE 09/20/2016 0046   LEUKOCYTESUR NEGATIVE 09/20/2016 0046   Sepsis Labs Invalid input(s): PROCALCITONIN,  WBC,  LACTICIDVEN Microbiology No results found for this or any previous visit (from the past 240 hour(s)).   Time coordinating discharge: Over 30 minutes  SIGNED:   Regalado, Belkys A, MD  [facility] 09/21/2016, 10:49 AM Pager [phone]  If 7PM-7AM, please contact night-coverage [email or web address] Password [email or web address] 

## 2016-09-21 NOTE — Progress Notes (Addendum)
Subjective:  His blood pressure is better controlled.  He complains mostly of a headache and feeling his pulse that may be a side effect of the BiDil.  His edema has gone down.  He is not having any more chest discomfort.    Objective:  Vital Signs in the last 24 hours: BP 133/63 (BP Location: Right Arm)   Pulse 83   Temp 99 F (37.2 C) (Oral)   Resp 16   Ht 5\' 6"  (1.676 m)   Wt 106.1 kg (234 lb)   SpO2 97%   BMI 37.77 kg/m   Physical Exam: Obese black male no acute distress Lungs:  Clear Cardiac:  Regular rhythm, normal S1 and S2, no S3 Abdomen:  Soft, nontender, no masses Extremities:  No edema present  Intake/Output from previous day: 07/08 0701 - 07/09 0700 In: 760 [P.O.:760] Out: 1225 [Urine:1225]  Weight Filed Weights   09/19/16 1947 09/20/16 0123  Weight: 111.1 kg (245 lb) 106.1 kg (234 lb)    Lab Results: Basic Metabolic Panel:  Recent Labs  16/12/9605/08/18 0908 09/21/16 0703  NA 137 133*  K 3.5 3.6  CL 104 101  CO2 25 23  GLUCOSE 144* 153*  BUN 17 23*  CREATININE 1.65* 1.95*   CBC:  Recent Labs  09/19/16 1950  WBC 11.8*  HGB 15.9  HCT 44.4  MCV 89.3  PLT 204   Cardiac Enzymes: Troponin (Point of Care Test)  Recent Labs  09/19/16 2359  TROPIPOC 0.04   Cardiac Panel (last 3 results)  Recent Labs  09/20/16 0908 09/20/16 1510 09/20/16 2240  TROPONINI 0.04* 0.05* 0.05*    Telemetry: Reviewed Personally.  Normal sinus rhythm  Assessment/Plan:  1.  Accelerated hypertension somewhat better control 2.  Atypical chest pain resolved 3.  Trivial flat troponin elevation likely due to accelerated hypertension 4.  Small ascending aneurysm that just needs yearly follow-up 5.  Stage III chronic kidney disease likely due to hypertension somewhat worse may be due to acceleration of hypertension 6.  Hemophilia a  Recommendations:  I would stop the BiDil as this is likely not to be a good medicine for long-term use because of headache and other  side effects.  He can be changed just to hydralazine.  I would avoid Ace or are bright now until we know what his renal function is.  A diuretic based blood pressure regimen usually works best in black individuals.  He previously was on carvedilol as an outpatient.  He has not been taking it then I would keep him on 12.5 twice a day of carvedilol, amlodipine, hydralazine, and furosemide.  His hemophilia a he makes invasive evaluation difficult and I would suggest either outpatient stress testing to determine exercise tolerance or perhaps a cardiac CTA once we know that his renal function has stabilized.  [facility] and would suggest a follow-up with him within one week.  I spoke with the patient's mother by phone at her request as well as the patient and we extensively talked about that he would need surveillance for aneurysm once a year with a CT scan, follow up with his cardiologist and stressed that he already has significant end organ damage to his kidneys his heart and his aorta and that excellent blood pressure control would be important.     Darden PalmerW. Spencer Lawerence Dery, Jr.  MD [facility]  09/21/2016, 9:06 AM

## 2016-09-21 NOTE — Progress Notes (Signed)
Mrs.Lydon (mother of the patient) would like the Cardiologist and the Hospitalist to contact her @ [phone] in regards to her son, [name].

## 2016-12-30 ENCOUNTER — Emergency Department (HOSPITAL_COMMUNITY)
Admission: EM | Admit: 2016-12-30 | Discharge: 2016-12-30 | Disposition: A | Discharge status: Home / Self Care | Payer: BLUE CROSS/BLUE SHIELD | Attending: Emergency Medicine | Admitting: Emergency Medicine

## 2016-12-30 ENCOUNTER — Encounter (HOSPITAL_COMMUNITY): Payer: Self-pay | Admitting: *Deleted

## 2016-12-30 DIAGNOSIS — R1032 Left lower quadrant pain: Secondary | ICD-10-CM

## 2016-12-30 DIAGNOSIS — R1030 Lower abdominal pain, unspecified: Secondary | ICD-10-CM | POA: Insufficient documentation

## 2016-12-30 DIAGNOSIS — N182 Chronic kidney disease, stage 2 (mild): Secondary | ICD-10-CM | POA: Insufficient documentation

## 2016-12-30 DIAGNOSIS — Z79899 Other long term (current) drug therapy: Secondary | ICD-10-CM | POA: Insufficient documentation

## 2016-12-30 DIAGNOSIS — I1310 Hypertensive heart and chronic kidney disease without heart failure, with stage 1 through stage 4 chronic kidney disease, or unspecified chronic kidney disease: Secondary | ICD-10-CM | POA: Insufficient documentation

## 2016-12-30 DIAGNOSIS — R110 Nausea: Secondary | ICD-10-CM | POA: Insufficient documentation

## 2016-12-30 DIAGNOSIS — Z87442 Personal history of urinary calculi: Secondary | ICD-10-CM | POA: Insufficient documentation

## 2016-12-30 HISTORY — DX: Calculus of kidney: N20.0

## 2016-12-30 LAB — URINALYSIS, ROUTINE W REFLEX MICROSCOPIC
BILIRUBIN URINE: NEGATIVE
Glucose, UA: 50 mg/dL — AB
HGB URINE DIPSTICK: NEGATIVE
Ketones, ur: NEGATIVE mg/dL
Leukocytes, UA: NEGATIVE
Nitrite: NEGATIVE
PROTEIN: 30 mg/dL — AB
SPECIFIC GRAVITY, URINE: 1.016 (ref 1.005–1.030)
Squamous Epithelial / LPF: NONE SEEN
pH: 8 (ref 5.0–8.0)

## 2016-12-30 MED ORDER — OXYCODONE-ACETAMINOPHEN 5-325 MG PO TABS
ORAL_TABLET | ORAL | Status: AC
  Filled 2016-12-30: qty 1

## 2016-12-30 MED ORDER — ONDANSETRON HCL 4 MG PO TABS: 4.0000 mg | ORAL_TABLET | Freq: Three times a day (TID) | ORAL | 0 refills | Status: AC | PRN

## 2016-12-30 MED ORDER — HYDROCODONE-ACETAMINOPHEN 5-325 MG PO TABS: 1.0000 | ORAL_TABLET | Freq: Four times a day (QID) | ORAL | 0 refills | Status: DC | PRN

## 2016-12-30 MED ORDER — ONDANSETRON 4 MG PO TBDP
ORAL_TABLET | ORAL | Status: AC
  Filled 2016-12-30: qty 1

## 2016-12-30 MED ORDER — OXYCODONE-ACETAMINOPHEN 5-325 MG PO TABS
1.0000 | ORAL_TABLET | ORAL | Status: DC | PRN
  Administered 2016-12-30: 1 via ORAL

## 2016-12-30 MED ORDER — ONDANSETRON 4 MG PO TBDP
4.0000 mg | ORAL_TABLET | Freq: Once | ORAL | Status: AC
  Administered 2016-12-30: 4 mg via ORAL

## 2016-12-30 MED ORDER — TAMSULOSIN HCL 0.4 MG PO CAPS: 0.4000 mg | ORAL_CAPSULE | Freq: Every day | ORAL | 0 refills | Status: DC

## 2016-12-30 NOTE — ED Notes (Signed)
Pt stable and ambulatory for discharge

## 2016-12-30 NOTE — Discharge Instructions (Signed)
Please call your urologist in the morning to schedule an appointment regarding today's visit. Please strain your urine.  I have written you a prescription for pain medicine. Please do not drink alcohol or drive while taking this medication as it can make you drowsy. I have also written a prescription for a medicine for nausea, please take this as needed. Finally I have written a prescription for a medicine to help with urine flow, this is called flomax.   Please take your blood pressure medications when you get home.  Please return to the emergency department if he develops fever, are unable to urinate, have nausea/vomiting that does not improve with nausea medicine, worsening left sided pain or any new or worsening symptoms

## 2016-12-30 NOTE — ED Provider Notes (Signed)
[facility] EMERGENCY DEPARTMENT Provider Note   CSN: [id number] Arrival date & time: 12/30/16  1428     History   Chief Complaint Chief Complaint  Patient presents with  . Groin Pain    HPI Noelle Hoogland is a 46 y.o. male.  HPI   Mr. Ignasiak is a 46 year old male with a history of recurrent kidney stones, hypertension, hyperlipidemia, hemophilia, OSA, obesity who presents the emergency department stating "I have a kidney stone." Patient says that he had sudden onset left flank and back pain at 1 PM while at work this afternoon. Pain is 10/10 in severity, constant and "sharp" in nature. There is associated nausea. Patient states that the pain has moved down his left flank and into his left groin since initial symptoms began. He also endorses some sharp and shooting pain up his left side with urination. States that these are the symptoms that he gets when he has a kidney stone. Reports that his last kidney stone was several years ago, has had 2 total kidney stones in his life. Denies hematuria, testicular/penile pain, trouble urinating, urinary frequency, fever, abdominal pain, diarrhea, chest pain, shortness of breath, fatigue, numbness, weakness. Was given a Percocet and Zofran in triage and states that his nausea and pain has resolved. States that he would like a glass of water.  Past Medical History:  Diagnosis Date  . Dyspnea   . Graves' disease   . Hemophilia (HCC)   . HLD (hyperlipidemia)   . HTN (hypertension)   . Kidney stone   . OSA on CPAP     Patient Active Problem List   Diagnosis Date Noted  . CKD (chronic kidney disease) stage 2, GFR 60-89 ml/min 09/20/2016  . Obesity (BMI 30-39.9) 09/20/2016  . Ascending aortic aneurysm (HCC) 09/20/2016  . 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 Hyperlipidemia 05/04/2008  . Hypertensive heart disease without CHF 05/04/2008  . Obstructive sleep apnea 04/13/2008    Past Surgical History:  Procedure  Laterality Date  . TONSILLECTOMY         Home Medications    Prior to Admission medications   Medication Sig Start Date End Date Taking? Authorizing Provider  amLODipine (NORVASC) 5 MG tablet Take 1 tablet (5 mg total) by mouth daily. 09/22/16   Regalado, Belkys A, MD  CALCIUM PO Take 2 tablets by mouth daily.    [provider]  carvedilol (COREG) 12.5 MG tablet Take 1 tablet (12.5 mg total) by mouth 2 (two) times daily with a meal. 09/21/16   Regalado, Belkys A, MD  CYANOCOBALAMIN PO Take 2 tablets by mouth daily.    [provider]  furosemide (LASIX) 40 MG tablet Take 1 tablet (40 mg total) by mouth daily. 09/22/16   Regalado, Belkys A, MD  hydrALAZINE (APRESOLINE) 50 MG tablet Take 1 tablet (50 mg total) by mouth 2 (two) times daily. 09/21/16   Regalado, Belkys A, MD  HYDROcodone-acetaminophen (NORCO/VICODIN) 5-325 MG tablet Take 1 tablet by mouth every 6 (six) hours as needed. 12/30/16   Kellie Shropshire, PA-C  MAGNESIUM PO Take 2 tablets by mouth daily.    [provider]  ondansetron (ZOFRAN) 4 MG tablet Take 1 tablet (4 mg total) by mouth every 8 (eight) hours as needed for nausea or vomiting. 12/30/16   Kellie Shropshire, PA-C  tamsulosin (FLOMAX) 0.4 MG CAPS capsule Take 1 capsule (0.4 mg total) by mouth daily. 12/30/16   Kellie Shropshire, PA-C    Family  History Family History  Problem Relation Age of Onset  . Hypertension Other     Social History Social History  Substance Use Topics  . Smoking status: Never Smoker  . Smokeless tobacco: Never Used  . Alcohol use No     Allergies   Aspirin and Dilaudid [hydromorphone hcl]   Review of Systems Review of Systems  Constitutional: Negative for appetite change, chills, fatigue and fever.  HENT: Negative for congestion.   Respiratory: Negative for shortness of breath.   Cardiovascular: Negative for chest pain and palpitations.  Gastrointestinal: Positive for nausea. Negative for abdominal  pain, blood in stool, diarrhea and vomiting.  Genitourinary: Positive for flank pain. Negative for decreased urine volume, difficulty urinating, discharge, dysuria, frequency, hematuria, penile pain, penile swelling, scrotal swelling and testicular pain.  Musculoskeletal: Positive for back pain. Negative for gait problem, myalgias and neck pain.  Skin: Negative for rash and wound.  Neurological: Negative for dizziness, weakness, light-headedness, numbness and headaches.  Psychiatric/Behavioral: Negative for agitation.     Physical Exam Updated Vital Signs BP (!) 169/101   Pulse 68   Temp (!) 97.4 F (36.3 C) (Oral)   Resp 20   SpO2 97%   Physical Exam  Constitutional: He is oriented to person, place, and time. He appears well-developed and well-nourished. No distress.  Patient sitting, and answering questions appropriately.  HENT:  Head: Normocephalic and atraumatic.  Mouth/Throat: Oropharynx is clear and moist. No oropharyngeal exudate.  Eyes: Pupils are equal, round, and reactive to light. Conjunctivae are normal. Right eye exhibits no discharge. Left eye exhibits no discharge. No scleral icterus.  Neck: Normal range of motion. Neck supple.  Cardiovascular: Normal rate, regular rhythm and intact distal pulses.  Exam reveals no friction rub.   No murmur heard. Pulmonary/Chest: Effort normal and breath sounds normal. No respiratory distress. He has no wheezes. He has no rales.  Abdominal: Soft. Bowel sounds are normal.  Tender to palpation over left groin. No guarding/rigidity. No CVA tenderness.  Musculoskeletal: Normal range of motion.  No tenderness to palpation over thoracic or lumbar spinous processes.  Lymphadenopathy:    He has no cervical adenopathy.  Neurological: He is alert and oriented to person, place, and time.  Skin: Skin is warm and dry. Capillary refill takes less than 2 seconds. He is not diaphoretic.  Psychiatric: He has a normal mood and affect. His behavior  is normal.  Nursing note and vitals reviewed.    ED Treatments / Results  Labs (all labs ordered are listed, but only abnormal results are displayed) Labs Reviewed  URINALYSIS, ROUTINE W REFLEX MICROSCOPIC - Abnormal; Notable for the following:       Result Value   Glucose, UA 50 (*)    Protein, ur 30 (*)    Bacteria, UA RARE (*)    All other components within normal limits    EKG  EKG Interpretation None       Radiology No results found.  Procedures Procedures (including critical care time)  Medications Ordered in ED Medications  ondansetron (ZOFRAN-ODT) disintegrating tablet 4 mg (4 mg Oral Given 12/30/16 1444)     Initial Impression / Assessment and Plan / ED Course  I have reviewed the triage vital signs and the nursing notes.  Pertinent labs & imaging results that were available during my care of the patient were reviewed by me and considered in my medical decision making (see chart for details).     Pt with likely kidney stone given RBCs  on UA and history of having similar symptoms to his previous episodes of kidney stones. Pain and nausea managed in the ED. He is able to tolerate PO fluids at bedside. Discussed patient with Dr. Donnald GarrePfeiffer who agrees that given history and lab work, do not need a CT stone study at this time. Patient has a urologist which he follows up with from previous kidney stones. Will send him home with pain management, anti-emetics and close urology follow up. Have discussed return precautions including worsening pain with fever, difficulty urinating, uncontrolled nausea/vomiting despite medications. Patient agrees and voices understanding at bedside. He is afebrile. He is hypertensive, of note he did not take any of his 3 home blood pressure medications. Have advised patient to take these medications when he gets home. Discussed patient with Dr. Donnald GarrePfeiffer who agrees with plan and discharge.    Final Clinical Impressions(s) / ED Diagnoses    Final diagnoses:  Left inguinal pain    New Prescriptions Discharge Medication List as of 12/30/2016  6:56 PM    START taking these medications   Details  HYDROcodone-acetaminophen (NORCO/VICODIN) 5-325 MG tablet Take 1 tablet by mouth every 6 (six) hours as needed., Starting Wed 12/30/2016, Print    ondansetron (ZOFRAN) 4 MG tablet Take 1 tablet (4 mg total) by mouth every 8 (eight) hours as needed for nausea or vomiting., Starting Wed 12/30/2016, Print    tamsulosin (FLOMAX) 0.4 MG CAPS capsule Take 1 capsule (0.4 mg total) by mouth daily., Starting Wed 12/30/2016, Print         Kellie ShropshireShrosbree, Marjan Rosman J, PA-C 12/31/16 [id number]    Arby BarrettePfeiffer, Marcy, MD 01/04/17 0830

## 2016-12-30 NOTE — ED Triage Notes (Signed)
Pt reports onset today 1300 of left side groin pain. Pt denies difficulty urinating today. Hx of kidney stones. Appears in severe pain at triage.

## 2018-06-09 ENCOUNTER — Ambulatory Visit (INDEPENDENT_AMBULATORY_CARE_PROVIDER_SITE_OTHER): Payer: Self-pay | Admitting: Internal Medicine

## 2018-06-09 ENCOUNTER — Encounter: Payer: Self-pay | Admitting: Internal Medicine

## 2018-06-09 ENCOUNTER — Other Ambulatory Visit: Payer: Self-pay

## 2018-06-09 VITALS — BP 158/90 | HR 82 | Ht 66.0 in | Wt 216.2 lb

## 2018-06-09 DIAGNOSIS — I119 Hypertensive heart disease without heart failure: Secondary | ICD-10-CM

## 2018-06-09 DIAGNOSIS — G4733 Obstructive sleep apnea (adult) (pediatric): Secondary | ICD-10-CM

## 2018-06-09 NOTE — Patient Instructions (Signed)
Order- schedule home sleep test   Dx OSA  Please call me about 2 weeks after the study is done, for results and recommendations  If appropriate, we may be able to start you on treatment before we see you next.

## 2018-06-09 NOTE — Progress Notes (Signed)
HPI M never smoker followed for obstructive sleep apnea complicated by hemophilia A, HBP, Factor VIII disorder, obesity NPSG 05/05/08 AHI 92.5/ hr, CPAP titrated to 19> AHI 0/hr  -------------------------------------------------------------------------------------- 04/18/12- 41 yoM never smoker followed for obstructive sleep apnea complicated by hemophilia A, HBP, Factor VIII disorder, obesity FOLLOWS FOR: wears CPAP19/ Advanced every night for about 5 hours; last seen 02/27/10. NPSG 05/05/08 AHI 92.5/ hr, CPAP titrated to 19> AHI 0/hr. He sometimes gets annoyed having CPAP on his face, but states that the pressure is comfortable. Headache if he takes CPAP off in his sleep. No longer needing temazepam. Does sleep elevated on a wedge.  06/09/2018- 48 yo M never smoker followed for obstructive sleep apnea complicated by hemophilia A, HBP, Factor VIII disorder, obesity  -----referred by Murlean Caller; OSA on CPAP, states he needs new machine/supplies; saw CY in 2014 He had been lost to f/u and noncompliant with CPAP. Now returns to re-establish. Body weight today 216 lbs  BP 158/90 He feels throat closing as he tries to fall, especially if supine.  He says CPAP machine stopped "blowing air" and does not work. ENT surgery-tonsils.  Denies awareness of heart problems palpitations in the past.  Denies lung disease. Works at [employer] starts each morning at [employer].  No insurance.  ROS-see HPI   + = positive Constitutional:   +  weight loss, night sweats, fevers, chills, fatigue, lassitude. HEENT:   No-  headaches, difficulty swallowing, tooth/dental problems, sore throat,       No-  sneezing, itching, ear ache, nasal congestion, post nasal drip,  CV:  No-   chest pain, orthopnea, PND, swelling in lower extremities, anasarca,                                  dizziness, palpitations Resp: No-   shortness of breath with exertion or at rest.              No-   productive cough,  No non-productive cough,   No- coughing up of blood.              No-   change in color of mucus.  No- wheezing.   Skin: No-   rash or lesions. GI:  No-   heartburn, indigestion, abdominal pain, nausea, vomiting,  GU:  MS:  No-   joint pain or swelling.   Neuro-     nothing unusual Psych:  No- change in mood or affect. No depression or anxiety.  No memory loss.  OBJ- Physical Exam General- Alert, Oriented, Affect-appropriate, Distress- none acute. Stocky body build Skin- rash-none, lesions- none, excoriation- none Lymphadenopathy- none Head- atraumatic            Eyes- Gross vision intact, PERRLA, conjunctivae and secretions clear            Ears- Hearing, canals-normal            Nose- Clear, no-Septal dev, mucus, polyps, erosion, perforation             Throat- Mallampati III-IV , mucosa clear , drainage- none, tonsils- absent Neck- flexible , trachea midline, no stridor , thyroid nl, carotid no bruit Chest - symmetrical excursion , unlabored           Heart/CV- RRR , no murmur , no gallop  , no rub, nl s1 s2                           -  JVD- none , edema- none, stasis changes- none, varices- none           Lung- clear to P&A, wheeze- none, cough- none , dullness-none, rub- none           Chest wall-  Abd-  Br/ Gen/ Rectal- Not done, not indicated Extrem- cyanosis- none, clubbing, none, atrophy- none, strength- nl Neuro- grossly intact to observation

## 2018-06-10 NOTE — Assessment & Plan Note (Signed)
BP on arrival today 158/ 90 I have asked him to discuss with his PCP. Finances are a problem

## 2018-06-10 NOTE — Assessment & Plan Note (Signed)
He has been off CPAP probably several years, since machine stopped working.  I explained he would need new sleep study to recertify.  Hopefully we can get this done as a home sleep test when the Covid restrictions are  Dropped. CPAP is likely to be the best option if he needs treatment. He has lost a lot of weight since his original study and I explained that was beneficial.

## 2018-06-13 ENCOUNTER — Institutional Professional Consult (permissible substitution): Payer: Self-pay | Admitting: Internal Medicine

## 2018-07-28 ENCOUNTER — Emergency Department (HOSPITAL_COMMUNITY)
Admission: EM | Admit: 2018-07-28 | Discharge: 2018-07-29 | Disposition: A | Discharge status: Home / Self Care | Payer: Medicaid Other | Attending: Emergency Medicine | Admitting: Emergency Medicine

## 2018-07-28 ENCOUNTER — Other Ambulatory Visit: Payer: Self-pay

## 2018-07-28 ENCOUNTER — Encounter (HOSPITAL_COMMUNITY): Payer: Self-pay | Admitting: Emergency Medicine

## 2018-07-28 DIAGNOSIS — N182 Chronic kidney disease, stage 2 (mild): Secondary | ICD-10-CM | POA: Insufficient documentation

## 2018-07-28 DIAGNOSIS — Z79899 Other long term (current) drug therapy: Secondary | ICD-10-CM | POA: Insufficient documentation

## 2018-07-28 DIAGNOSIS — H6123 Impacted cerumen, bilateral: Secondary | ICD-10-CM | POA: Insufficient documentation

## 2018-07-28 DIAGNOSIS — R590 Localized enlarged lymph nodes: Secondary | ICD-10-CM | POA: Insufficient documentation

## 2018-07-28 DIAGNOSIS — I129 Hypertensive chronic kidney disease with stage 1 through stage 4 chronic kidney disease, or unspecified chronic kidney disease: Secondary | ICD-10-CM | POA: Insufficient documentation

## 2018-07-28 DIAGNOSIS — H60502 Unspecified acute noninfective otitis externa, left ear: Secondary | ICD-10-CM | POA: Insufficient documentation

## 2018-07-28 MED ORDER — DOCUSATE SODIUM 50 MG/5ML PO LIQD
50.0000 mg | Freq: Once | ORAL | Status: AC
  Administered 2018-07-29: 50 mg via OTIC
  Filled 2018-07-28: qty 10

## 2018-07-28 NOTE — ED Triage Notes (Signed)
Pt states left ear starting closing a day and a half ago with moderate hearing loss.

## 2018-07-29 MED ORDER — ACETAMINOPHEN 500 MG PO TABS
1000.0000 mg | ORAL_TABLET | Freq: Once | ORAL | Status: AC
Start: 2018-07-29 — End: 2018-07-29
  Administered 2018-07-29: 1000 mg via ORAL
  Filled 2018-07-29: qty 2

## 2018-07-29 MED ORDER — OFLOXACIN 0.3 % OP SOLN
5.0000 [drp] | Freq: Every day | OPHTHALMIC | Status: DC
  Administered 2018-07-29: 03:00:00 5 [drp] via OTIC
  Filled 2018-07-29: qty 5

## 2018-07-29 MED ORDER — OFLOXACIN 0.3 % OT SOLN: 10.0000 [drp] | Freq: Every day | OTIC | 0 refills | Status: AC

## 2018-07-29 MED ORDER — DOXYCYCLINE HYCLATE 100 MG PO CAPS: 100.0000 mg | ORAL_CAPSULE | Freq: Two times a day (BID) | ORAL | 0 refills | Status: AC

## 2018-07-29 NOTE — ED Provider Notes (Signed)
[facility] EMERGENCY DEPARTMENT Provider Note   CSN: [id number] Arrival date & time: 07/28/18  2234    History   Chief Complaint Chief Complaint  Patient presents with  . Otalgia    HPI Earl Williams is a 48 y.o. male with a hx of Graves' disease, hemophilia, hypertension, hyperlipidemia presents to the Emergency Department complaining of gradual, persistent, progressively worsening pain and swelling to the left ear canal onset 2 days ago. Associated symptoms include lymphadenopathy.  Pt reports attempting to clean his ears with soap and water and use of a syringe without relief.  He denies usage of any other foreign body including Bobby pins, pencils, pens or other objects to scratch or clean his ear.  Patient denies dental pain or facial swelling.  Patient reports that palpation of the area in front of his ear makes symptoms worse.  Nothing seems to make them better.  He has associated actively decreased hearing in the left ear.  Pt denies nasal drainage, headache, facial pain, sinus congestion, cough, neck pain, neck stiffness, shortness of breath, domino pain, nausea, vomiting, diarrhea, weakness, dizziness, syncope.  Patient denies a history of immunocompromise, HIV, diabetes.      The history is provided by the patient and medical records. No language interpreter was used.    Past Medical History:  Diagnosis Date  . Dyspnea   . Graves' disease   . Hemophilia (HCC)   . HLD (hyperlipidemia)   . HTN (hypertension)   . Kidney stone   . OSA on CPAP     Patient Active Problem List   Diagnosis Date Noted  . CKD (chronic kidney disease) stage 2, GFR 60-89 ml/min 09/20/2016  . Obesity (BMI 30-39.9) 09/20/2016  . Ascending aortic aneurysm (HCC) 09/20/2016  . 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 Hyperlipidemia 05/04/2008  . Hypertensive heart disease without CHF 05/04/2008  . Obstructive sleep apnea 04/13/2008    Past Surgical History:  Procedure  Laterality Date  . TONSILLECTOMY          Home Medications    Prior to Admission medications   Medication Sig Start Date End Date Taking? Authorizing Provider  amLODipine (NORVASC) 5 MG tablet Take 1 tablet (5 mg total) by mouth daily. Patient not taking: Reported on 06/09/2018 09/22/16   Regalado, Jon Billings A, MD  CALCIUM PO Take 2 tablets by mouth daily.    [provider]  carvedilol (COREG) 12.5 MG tablet Take 1 tablet (12.5 mg total) by mouth 2 (two) times daily with a meal. 09/21/16   Regalado, Belkys A, MD  CYANOCOBALAMIN PO Take 2 tablets by mouth daily.    [provider]  doxycycline (VIBRAMYCIN) 100 MG capsule Take 1 capsule (100 mg total) by mouth 2 (two) times daily for 7 days. 07/29/18 08/05/18  Braelynn Benning, Dahlia Client, PA-C  furosemide (LASIX) 40 MG tablet Take 1 tablet (40 mg total) by mouth daily. Patient not taking: Reported on 06/09/2018 09/22/16   Regalado, Jon Billings A, MD  hydrALAZINE (APRESOLINE) 50 MG tablet Take 1 tablet (50 mg total) by mouth 2 (two) times daily. 09/21/16   Regalado, Belkys A, MD  HYDROcodone-acetaminophen (NORCO/VICODIN) 5-325 MG tablet Take 1 tablet by mouth every 6 (six) hours as needed. Patient not taking: Reported on 06/09/2018 12/30/16   Kellie Shropshire, PA-C  MAGNESIUM PO Take 2 tablets by mouth daily.    [provider]  ofloxacin (FLOXIN) 0.3 % OTIC solution Place 10 drops into the left ear daily for 7 days.  07/29/18 08/05/18  Zuhayr Deeney, Dahlia ClientHannah, PA-C  ondansetron (ZOFRAN) 4 MG tablet Take 1 tablet (4 mg total) by mouth every 8 (eight) hours as needed for nausea or vomiting. 12/30/16   Kellie ShropshireShrosbree, Emily J, PA-C  tamsulosin (FLOMAX) 0.4 MG CAPS capsule Take 1 capsule (0.4 mg total) by mouth daily. Patient not taking: Reported on 06/09/2018 12/30/16   Kellie ShropshireShrosbree, Emily J, PA-C    Family History Family History  Problem Relation Age of Onset  . Hypertension Other     Social History Social History   Tobacco Use  . Smoking  status: Never Smoker  . Smokeless tobacco: Never Used  Substance Use Topics  . Alcohol use: No  . Drug use: No     Allergies   Aspirin and Dilaudid [hydromorphone hcl]   Review of Systems Review of Systems  Constitutional: Negative for appetite change, diaphoresis, fatigue, fever and unexpected weight change.  HENT: Positive for ear pain. Negative for facial swelling and mouth sores.   Eyes: Negative for visual disturbance.  Respiratory: Negative for cough, chest tightness, shortness of breath and wheezing.   Cardiovascular: Negative for chest pain.  Gastrointestinal: Negative for abdominal pain, constipation, diarrhea, nausea and vomiting.  Musculoskeletal: Negative for back pain, neck pain and neck stiffness.  Skin: Negative for rash.  Allergic/Immunologic: Negative for immunocompromised state.  Neurological: Negative for syncope, light-headedness and headaches.  Hematological: Positive for adenopathy. Does not bruise/bleed easily.  Psychiatric/Behavioral: Negative for sleep disturbance. The patient is not nervous/anxious.      Physical Exam Updated Vital Signs BP (!) 145/82   Pulse 90   Temp 99.6 F (37.6 C) (Oral)   Resp 16   Ht 5\' 6"  (1.676 m)   Wt 95.3 kg   SpO2 97%   BMI 33.89 kg/m   Physical Exam Vitals signs and nursing note reviewed.  Constitutional:      General: He is not in acute distress.    Appearance: He is well-developed. He is not diaphoretic.  HENT:     Head: Normocephalic and atraumatic.     Jaw: No trismus, tenderness, swelling, pain on movement or malocclusion.      Right Ear: External ear normal.     Left Ear: Tenderness present.     Ears:     Comments: Bilateral cerumen impaction Slight swelling of the left external canal    Nose: No mucosal edema, congestion or rhinorrhea.     Right Sinus: No maxillary sinus tenderness or frontal sinus tenderness.     Left Sinus: No maxillary sinus tenderness or frontal sinus tenderness.      Mouth/Throat:     Lips: Pink.     Mouth: Mucous membranes are moist. Mucous membranes are not pale and not cyanotic.     Dentition: Normal dentition. No dental tenderness, gingival swelling, dental abscesses or gum lesions.     Pharynx: Uvula midline. No oropharyngeal exudate or posterior oropharyngeal erythema.     Tonsils: No tonsillar abscesses.  Eyes:     Conjunctiva/sclera: Conjunctivae normal.     Pupils: Pupils are equal, round, and reactive to light.  Neck:     Musculoskeletal: Full passive range of motion without pain and normal range of motion.  Cardiovascular:     Rate and Rhythm: Normal rate.  Pulmonary:     Effort: Pulmonary effort is normal.  Abdominal:     General: There is no distension.  Musculoskeletal: Normal range of motion.  Lymphadenopathy:     Head:     Right  side of head: No submental, submandibular, tonsillar, preauricular, posterior auricular or occipital adenopathy.     Left side of head: Tonsillar and preauricular adenopathy present. No submental, submandibular, posterior auricular or occipital adenopathy.     Cervical: No cervical adenopathy.     Right cervical: No superficial cervical adenopathy.    Left cervical: No superficial cervical adenopathy.  Skin:    General: Skin is warm and dry.     Findings: No rash.  Neurological:     Mental Status: He is alert.     Comments: Answers questions appropriately  speech is clear and goal oriented.  Psychiatric:        Thought Content: Thought content normal.      ED Treatments / Results   Procedures .Ear Cerumen Removal Date/Time: 07/29/2018 1:28 AM Performed by: Dierdre Forth, PA-C Authorized by: Dierdre Forth, PA-C   Consent:    Consent obtained:  Verbal   Consent given by:  Patient   Risks discussed:  Pain, TM perforation, dizziness and incomplete removal   Alternatives discussed:  No treatment Procedure details:    Location:  R ear   Procedure type: irrigation    Post-procedure details:    Inspection:  TM intact   Hearing quality:  Improved   Patient tolerance of procedure:  Tolerated well, no immediate complications .Ear Cerumen Removal Date/Time: 07/29/2018 1:28 AM Performed by: Dierdre Forth, PA-C Authorized by: Dierdre Forth, PA-C   Consent:    Consent obtained:  Verbal   Consent given by:  Patient   Risks discussed:  Bleeding, dizziness, incomplete removal, pain and TM perforation   Alternatives discussed:  No treatment Procedure details:    Location:  L ear   Procedure type: irrigation   Post-procedure details:    Inspection:  Macerated skin and TM intact   Hearing quality:  Improved   Patient tolerance of procedure:  Tolerated well, no immediate complications Comments:     Canal with erythema and edema, small amount of macerated skin.     (including critical care time)  Medications Ordered in ED Medications  ofloxacin (OCUFLOX) 0.3 % ophthalmic solution 5 drop (has no administration in time range)  docusate (COLACE) 50 MG/5ML liquid 50 mg (50 mg Both EARS Given 07/29/18 0010)  acetaminophen (TYLENOL) tablet 1,000 mg (1,000 mg Oral Given 07/29/18 0055)     Initial Impression / Assessment and Plan / ED Course  I have reviewed the triage vital signs and the nursing notes.  Pertinent labs & imaging results that were available during my care of the patient were reviewed by me and considered in my medical decision making (see chart for details).  Clinical Course as of Jul 28 199  Fri Jul 29, 2018  0128 HTN.  Hx of same.  No CP or SOB.  BP(!): 145/82 [HM]    Clinical Course User Index [HM] Nadiah Corbit, Dahlia Client, PA-C       Pt presents with left otitis and decreased hearing.  Bilateral cerumen impaction.  After removal, right canal and TM exam normal.  Left canal is erythematous and edematous with a some maceration of the skin.  Findings consistent with otitis externa.  I suspect this is 2/2 to patient's attempts  to clean his ear with soap and water.  Ear wick placed and pt given ofloxacin here in the ED. Some concern for mild surrounding cellulitis with increased warmth, though this may be reactive.  Pt will also be given doxycycline to cover for potential cellulitis.  Discussed importance  of close follow-up with PCP and reasons to return to the ED.  Pt states understanding and is in agreement with the plan.    Final Clinical Impressions(s) / ED Diagnoses   Final diagnoses:  Acute otitis externa of left ear, unspecified type    ED Discharge Orders         Ordered    ofloxacin (FLOXIN) 0.3 % OTIC solution  Daily     07/29/18 0134    doxycycline (VIBRAMYCIN) 100 MG capsule  2 times daily     07/29/18 0134           Adysen Raphael, Boyd Kerbs 07/29/18 0231    Gilda Crease, MD 07/29/18 [phone]

## 2018-07-29 NOTE — Discharge Instructions (Signed)
1. Medications: Ofloxacin drops, doxycycline tablets, usual home medications 2. Treatment: rest, drink plenty of fluids, leave ear wick in place until it falls out on its own 3. Follow Up: Please followup with your primary doctor in 2-3 days for discussion of your diagnoses and further evaluation after today's visit; if you do not have a primary care doctor use the resource guide provided to find one; Please return to the ER for fevers, worsening pain, continued swelling, vomiting, progressive hearing loss or other concerns.

## 2018-07-29 NOTE — ED Notes (Signed)
Patient verbalizes understanding of discharge instructions. Opportunity for questioning and answers were provided. Armband removed by staff, pt discharged from ED ambulatory.   

## 2018-07-29 NOTE — ED Notes (Signed)
Colace now dwelling in the left ear again d/t cerumen impaction.

## 2018-10-24 ENCOUNTER — Ambulatory Visit: Payer: Self-pay | Admitting: Internal Medicine

## 2019-03-21 ENCOUNTER — Other Ambulatory Visit: Payer: Self-pay | Admitting: Cardiology

## 2019-03-21 DIAGNOSIS — Z20822 Contact with and (suspected) exposure to covid-19: Secondary | ICD-10-CM

## 2019-03-23 LAB — NOVEL CORONAVIRUS, NAA: SARS-CoV-2, NAA: NOT DETECTED

## 2020-08-02 ENCOUNTER — Inpatient Hospital Stay (HOSPITAL_COMMUNITY)
Admission: EM | Admit: 2020-08-02 | Discharge: 2020-08-03 | DRG: 304 | Disposition: A | Discharge status: Home / Self Care | Payer: Self-pay | Attending: Internal Medicine | Admitting: Internal Medicine

## 2020-08-02 ENCOUNTER — Inpatient Hospital Stay (HOSPITAL_COMMUNITY): Payer: Self-pay

## 2020-08-02 ENCOUNTER — Emergency Department (HOSPITAL_COMMUNITY): Payer: Self-pay

## 2020-08-02 ENCOUNTER — Encounter (HOSPITAL_COMMUNITY): Payer: Self-pay | Admitting: Pulmonary Disease

## 2020-08-02 ENCOUNTER — Other Ambulatory Visit: Payer: Self-pay

## 2020-08-02 DIAGNOSIS — G4733 Obstructive sleep apnea (adult) (pediatric): Secondary | ICD-10-CM | POA: Diagnosis present

## 2020-08-02 DIAGNOSIS — I422 Other hypertrophic cardiomyopathy: Secondary | ICD-10-CM

## 2020-08-02 DIAGNOSIS — D66 Hereditary factor VIII deficiency: Secondary | ICD-10-CM

## 2020-08-02 DIAGNOSIS — R791 Abnormal coagulation profile: Secondary | ICD-10-CM | POA: Diagnosis present

## 2020-08-02 DIAGNOSIS — I129 Hypertensive chronic kidney disease with stage 1 through stage 4 chronic kidney disease, or unspecified chronic kidney disease: Secondary | ICD-10-CM | POA: Diagnosis present

## 2020-08-02 DIAGNOSIS — Z87442 Personal history of urinary calculi: Secondary | ICD-10-CM

## 2020-08-02 DIAGNOSIS — R262 Difficulty in walking, not elsewhere classified: Secondary | ICD-10-CM | POA: Diagnosis present

## 2020-08-02 DIAGNOSIS — Z886 Allergy status to analgesic agent status: Secondary | ICD-10-CM

## 2020-08-02 DIAGNOSIS — D684 Acquired coagulation factor deficiency: Secondary | ICD-10-CM | POA: Diagnosis present

## 2020-08-02 DIAGNOSIS — I6529 Occlusion and stenosis of unspecified carotid artery: Secondary | ICD-10-CM | POA: Diagnosis present

## 2020-08-02 DIAGNOSIS — N189 Chronic kidney disease, unspecified: Secondary | ICD-10-CM | POA: Diagnosis present

## 2020-08-02 DIAGNOSIS — Z79899 Other long term (current) drug therapy: Secondary | ICD-10-CM

## 2020-08-02 DIAGNOSIS — Z6835 Body mass index (BMI) 35.0-35.9, adult: Secondary | ICD-10-CM

## 2020-08-02 DIAGNOSIS — E039 Hypothyroidism, unspecified: Secondary | ICD-10-CM | POA: Diagnosis present

## 2020-08-02 DIAGNOSIS — G459 Transient cerebral ischemic attack, unspecified: Secondary | ICD-10-CM

## 2020-08-02 DIAGNOSIS — Z20822 Contact with and (suspected) exposure to covid-19: Secondary | ICD-10-CM | POA: Diagnosis present

## 2020-08-02 DIAGNOSIS — I159 Secondary hypertension, unspecified: Secondary | ICD-10-CM | POA: Diagnosis present

## 2020-08-02 DIAGNOSIS — E669 Obesity, unspecified: Secondary | ICD-10-CM | POA: Diagnosis present

## 2020-08-02 DIAGNOSIS — E0500 Thyrotoxicosis with diffuse goiter without thyrotoxic crisis or storm: Secondary | ICD-10-CM | POA: Diagnosis present

## 2020-08-02 DIAGNOSIS — I161 Hypertensive emergency: Principal | ICD-10-CM

## 2020-08-02 DIAGNOSIS — Z8673 Personal history of transient ischemic attack (TIA), and cerebral infarction without residual deficits: Secondary | ICD-10-CM

## 2020-08-02 DIAGNOSIS — Z8249 Family history of ischemic heart disease and other diseases of the circulatory system: Secondary | ICD-10-CM

## 2020-08-02 DIAGNOSIS — Z885 Allergy status to narcotic agent status: Secondary | ICD-10-CM

## 2020-08-02 DIAGNOSIS — Z888 Allergy status to other drugs, medicaments and biological substances status: Secondary | ICD-10-CM

## 2020-08-02 DIAGNOSIS — E785 Hyperlipidemia, unspecified: Secondary | ICD-10-CM | POA: Diagnosis present

## 2020-08-02 LAB — RAPID URINE DRUG SCREEN, HOSP PERFORMED
Amphetamines: NOT DETECTED
Barbiturates: NOT DETECTED
Benzodiazepines: NOT DETECTED
Cocaine: NOT DETECTED
Opiates: NOT DETECTED
Tetrahydrocannabinol: NOT DETECTED

## 2020-08-02 LAB — GLUCOSE, CAPILLARY
Glucose-Capillary: 140 mg/dL — ABNORMAL HIGH (ref 70–99)
Glucose-Capillary: 134 mg/dL — ABNORMAL HIGH (ref 70–99)

## 2020-08-02 LAB — ECHOCARDIOGRAM COMPLETE
AR max vel: 2.33 cm2
AV Area VTI: 2.04 cm2
AV Area mean vel: 2.02 cm2
AV Mean grad: 4 mmHg
AV Peak grad: 6.8 mmHg
Ao pk vel: 1.3 m/s
Area-P 1/2: 4.49 cm2
Calc EF: 71.8 %
Height: 66 in
MV M vel: 3.92 m/s
MV Peak grad: 61.5 mmHg
S' Lateral: 1.9 cm
Single Plane A2C EF: 72.9 %
Single Plane A4C EF: 71.7 %
Weight: 3266.34 oz

## 2020-08-02 LAB — I-STAT CHEM 8, ED
BUN: 24 mg/dL — ABNORMAL HIGH (ref 6–20)
Calcium, Ion: 1.18 mmol/L (ref 1.15–1.40)
Chloride: 105 mmol/L (ref 98–111)
Creatinine, Ser: 1.5 mg/dL — ABNORMAL HIGH (ref 0.61–1.24)
Glucose, Bld: 98 mg/dL (ref 70–99)
HCT: 44 % (ref 39.0–52.0)
Hemoglobin: 15 g/dL (ref 13.0–17.0)
Potassium: 3.9 mmol/L (ref 3.5–5.1)
Sodium: 139 mmol/L (ref 135–145)
TCO2: 24 mmol/L (ref 22–32)

## 2020-08-02 LAB — LIPID PANEL
Cholesterol: 137 mg/dL (ref 0–200)
HDL: 50 mg/dL (ref >40)
LDL Cholesterol: 65 mg/dL (ref 0–99)
Total CHOL/HDL Ratio: 2.7 RATIO
Triglycerides: 110 mg/dL (ref <150)
VLDL: 22 mg/dL (ref 0–40)

## 2020-08-02 LAB — COMPREHENSIVE METABOLIC PANEL
ALT: 27 U/L (ref 0–44)
AST: 23 U/L (ref 15–41)
Albumin: 3.4 g/dL — ABNORMAL LOW (ref 3.5–5.0)
Alkaline Phosphatase: 111 U/L (ref 38–126)
Anion gap: 7 (ref 5–15)
BUN: 24 mg/dL — ABNORMAL HIGH (ref 6–20)
CO2: 24 mmol/L (ref 22–32)
Calcium: 8.6 mg/dL — ABNORMAL LOW (ref 8.9–10.3)
Chloride: 105 mmol/L (ref 98–111)
Creatinine, Ser: 1.61 mg/dL — ABNORMAL HIGH (ref 0.61–1.24)
GFR, Estimated: 52 mL/min — ABNORMAL LOW (ref >60)
Glucose, Bld: 105 mg/dL — ABNORMAL HIGH (ref 70–99)
Potassium: 3.9 mmol/L (ref 3.5–5.1)
Sodium: 136 mmol/L (ref 135–145)
Total Bilirubin: 1.3 mg/dL — ABNORMAL HIGH (ref 0.3–1.2)
Total Protein: 6 g/dL — ABNORMAL LOW (ref 6.5–8.1)

## 2020-08-02 LAB — BASIC METABOLIC PANEL
Anion gap: 10 (ref 5–15)
BUN: 22 mg/dL — ABNORMAL HIGH (ref 6–20)
CO2: 22 mmol/L (ref 22–32)
Calcium: 9.3 mg/dL (ref 8.9–10.3)
Chloride: 105 mmol/L (ref 98–111)
Creatinine, Ser: 1.49 mg/dL — ABNORMAL HIGH (ref 0.61–1.24)
GFR, Estimated: 57 mL/min — ABNORMAL LOW (ref >60)
Glucose, Bld: 129 mg/dL — ABNORMAL HIGH (ref 70–99)
Potassium: 4 mmol/L (ref 3.5–5.1)
Sodium: 137 mmol/L (ref 135–145)

## 2020-08-02 LAB — CBC
HCT: 45.4 % (ref 39.0–52.0)
HCT: 53.1 % — ABNORMAL HIGH (ref 39.0–52.0)
Hemoglobin: 15.4 g/dL (ref 13.0–17.0)
Hemoglobin: 17.9 g/dL — ABNORMAL HIGH (ref 13.0–17.0)
MCH: 31.3 pg (ref 26.0–34.0)
MCH: 31.2 pg (ref 26.0–34.0)
MCHC: 33.9 g/dL (ref 30.0–36.0)
MCHC: 33.7 g/dL (ref 30.0–36.0)
MCV: 92.3 fL (ref 80.0–100.0)
MCV: 92.5 fL (ref 80.0–100.0)
Platelets: 141 10*3/uL — ABNORMAL LOW (ref 150–400)
Platelets: 146 10*3/uL — ABNORMAL LOW (ref 150–400)
RBC: 4.92 MIL/uL (ref 4.22–5.81)
RBC: 5.74 MIL/uL (ref 4.22–5.81)
RDW: 13 % (ref 11.5–15.5)
RDW: 13.1 % (ref 11.5–15.5)
WBC: 8.7 10*3/uL (ref 4.0–10.5)
WBC: 9.6 10*3/uL (ref 4.0–10.5)
nRBC: 0 % (ref 0.0–0.2)
nRBC: 0 % (ref 0.0–0.2)

## 2020-08-02 LAB — DIFFERENTIAL
Abs Immature Granulocytes: 0.01 10*3/uL (ref 0.00–0.07)
Basophils Absolute: 0 10*3/uL (ref 0.0–0.1)
Basophils Relative: 0 %
Eosinophils Absolute: 0.5 10*3/uL (ref 0.0–0.5)
Eosinophils Relative: 5 %
Immature Granulocytes: 0 %
Lymphocytes Relative: 40 %
Lymphs Abs: 3.5 10*3/uL (ref 0.7–4.0)
Monocytes Absolute: 0.8 10*3/uL (ref 0.1–1.0)
Monocytes Relative: 10 %
Neutro Abs: 3.9 10*3/uL (ref 1.7–7.7)
Neutrophils Relative %: 45 %

## 2020-08-02 LAB — URINALYSIS, ROUTINE W REFLEX MICROSCOPIC
Bacteria, UA: NONE SEEN
Bilirubin Urine: NEGATIVE
Glucose, UA: NEGATIVE mg/dL
Hgb urine dipstick: NEGATIVE
Ketones, ur: NEGATIVE mg/dL
Leukocytes,Ua: NEGATIVE
Nitrite: NEGATIVE
Protein, ur: 100 mg/dL — AB
Specific Gravity, Urine: 1.011 (ref 1.005–1.030)
pH: 6 (ref 5.0–8.0)

## 2020-08-02 LAB — TSH: TSH: <0.01 u[IU]/mL — ABNORMAL LOW (ref 0.350–4.500)

## 2020-08-02 LAB — RESP PANEL BY RT-PCR (FLU A&B, COVID) ARPGX2
Influenza A by PCR: NEGATIVE
Influenza B by PCR: NEGATIVE
SARS Coronavirus 2 by RT PCR: NEGATIVE

## 2020-08-02 LAB — HIV ANTIBODY (ROUTINE TESTING W REFLEX): HIV Screen 4th Generation wRfx: NONREACTIVE

## 2020-08-02 LAB — T4, FREE: Free T4: 2.91 ng/dL — ABNORMAL HIGH (ref 0.61–1.12)

## 2020-08-02 LAB — TROPONIN I (HIGH SENSITIVITY): Troponin I (High Sensitivity): 29 ng/L — ABNORMAL HIGH (ref <18)

## 2020-08-02 LAB — ETHANOL: Alcohol, Ethyl (B): <10 mg/dL (ref <10)

## 2020-08-02 LAB — HEMOGLOBIN A1C
Hgb A1c MFr Bld: 6.3 % — ABNORMAL HIGH (ref 4.8–5.6)
Mean Plasma Glucose: 134.11 mg/dL

## 2020-08-02 LAB — VITAMIN B12: Vitamin B-12: 832 pg/mL (ref 180–914)

## 2020-08-02 LAB — APTT: aPTT: 42 seconds — ABNORMAL HIGH (ref 24–36)

## 2020-08-02 LAB — MRSA PCR SCREENING: MRSA by PCR: POSITIVE — AB

## 2020-08-02 LAB — PROTIME-INR
INR: 1.1 (ref 0.8–1.2)
Prothrombin Time: 14 seconds (ref 11.4–15.2)

## 2020-08-02 MED ORDER — CLEVIDIPINE BUTYRATE 0.5 MG/ML IV EMUL
0.0000 mg/h | INTRAVENOUS | Status: DC
  Administered 2020-08-02: 1 mg/h via INTRAVENOUS
  Filled 2020-08-02: qty 100

## 2020-08-02 MED ORDER — HYDRALAZINE HCL 25 MG PO TABS
50.0000 mg | ORAL_TABLET | Freq: Once | ORAL | Status: AC
  Administered 2020-08-02: 50 mg via ORAL
  Filled 2020-08-02: qty 2

## 2020-08-02 MED ORDER — GADOBUTROL 1 MMOL/ML IV SOLN
9.0000 mL | Freq: Once | INTRAVENOUS | Status: AC | PRN
  Administered 2020-08-02: 9 mL via INTRAVENOUS

## 2020-08-02 MED ORDER — MUPIROCIN 2 % EX OINT
1.0000 "application " | TOPICAL_OINTMENT | Freq: Two times a day (BID) | CUTANEOUS | Status: DC
  Administered 2020-08-02: 1 via NASAL
  Filled 2020-08-02: qty 22

## 2020-08-02 MED ORDER — AMLODIPINE BESYLATE 10 MG PO TABS
10.0000 mg | ORAL_TABLET | Freq: Every day | ORAL | Status: DC
  Administered 2020-08-03: 10 mg via ORAL
  Filled 2020-08-02 (×2): qty 1

## 2020-08-02 MED ORDER — STROKE: EARLY STAGES OF RECOVERY BOOK
Freq: Once | Status: AC
  Filled 2020-08-02: qty 1

## 2020-08-02 MED ORDER — TAMSULOSIN HCL 0.4 MG PO CAPS
0.4000 mg | ORAL_CAPSULE | Freq: Every day | ORAL | Status: DC
  Filled 2020-08-02 (×2): qty 1

## 2020-08-02 MED ORDER — CARVEDILOL 12.5 MG PO TABS
25.0000 mg | ORAL_TABLET | Freq: Two times a day (BID) | ORAL | Status: DC
  Administered 2020-08-03: 25 mg via ORAL
  Filled 2020-08-02: qty 2

## 2020-08-02 MED ORDER — HYDRALAZINE HCL 50 MG PO TABS
50.0000 mg | ORAL_TABLET | Freq: Two times a day (BID) | ORAL | Status: DC
  Administered 2020-08-02 – 2020-08-03 (×3): 50 mg via ORAL
  Filled 2020-08-02 (×3): qty 1

## 2020-08-02 MED ORDER — CARVEDILOL 25 MG PO TABS
25.0000 mg | ORAL_TABLET | Freq: Two times a day (BID) | ORAL | Status: DC
  Administered 2020-08-02: 25 mg via ORAL
  Filled 2020-08-02: qty 1

## 2020-08-02 MED ORDER — HYDROCHLOROTHIAZIDE 12.5 MG PO CAPS
12.5000 mg | ORAL_CAPSULE | Freq: Every day | ORAL | Status: DC
  Administered 2020-08-03: 12.5 mg via ORAL
  Filled 2020-08-02 (×2): qty 1

## 2020-08-02 MED ORDER — HYDROCODONE-ACETAMINOPHEN 5-325 MG PO TABS
1.0000 | ORAL_TABLET | Freq: Four times a day (QID) | ORAL | Status: DC | PRN
Start: 2020-08-02 — End: 2020-08-03

## 2020-08-02 MED ORDER — CHLORHEXIDINE GLUCONATE CLOTH 2 % EX PADS
6.0000 | MEDICATED_PAD | Freq: Every day | CUTANEOUS | Status: DC
  Administered 2020-08-02 (×2): 6 via TOPICAL

## 2020-08-02 MED ORDER — ASPIRIN 81 MG PO CHEW
81.0000 mg | CHEWABLE_TABLET | Freq: Every day | ORAL | Status: DC
  Administered 2020-08-02 – 2020-08-03 (×2): 81 mg via ORAL
  Filled 2020-08-02 (×2): qty 1

## 2020-08-02 NOTE — Consult Note (Addendum)
[facility]  [phone] [phone]    INITIAL HEMATOLOGY CONSULTATION  Referring MD:  Dr. 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 is a 50 year old male with a past medical history significant for hypertension, hyperlipidemia, Graves' disease, obstructive sleep apnea on CPAP, mild acquired factor VIII deficiency.  He presented to the emergency room with acute neurologic symptoms.  The patient had slight difficulty walking as well as sensory loss on the right side of his body which progressed over a few seconds.  At the time of presentation, his symptoms were persistent.  CT of the head was normal.  He was seen by neurology and tPA was not given due to resolving symptoms and hemophilia A.  He had an MRI of the brain without contrast which showed no acute intracranial abnormality, multifocal hyperintense T2 weighted signal within the white matter most commonly seen in the setting of chronic small vessel ischemia.  He had an MRA of the head and neck which showed no abnormal intracranial enhancement and was a negative head MRA and neck MRA.  He was started on single agent antiplatelet therapy with aspirin 81 mg daily.  He was noted to have significant hypertension on admission and was started on Cleviprex which was stopped earlier today.  CBC on admission showed a normal WBC and slightly elevated hemoglobin at 17.9.  Platelets were 146,000.  The patient was seen in his hospital room today.  The patient reports that he has not had any bleeding since starting aspirin.  He reports that the numbness that he had in his right arm and leg have improved.  He reports that his symptoms are now transient.  He is not having any headaches or vision changes.  No chest pain, shortness of breath, cough, abdominal pain, nausea, vomiting reported.  Hematology was asked see the patient make  recommendations regarding use of antiplatelet therapy with history of hemophilia A and history of required inhibitor.  Past Medical History:  Diagnosis Date  . Dyspnea   . Graves' disease   . Hemophilia (HCC)   . HLD (hyperlipidemia)   . HTN (hypertension)   . Kidney stone   . OSA on CPAP   :    Past Surgical History:  Procedure Laterality Date  . TONSILLECTOMY    :   CURRENT MEDS: Current Facility-Administered Medications  Medication Dose Route Frequency Provider Last Rate Last Admin  .  stroke: mapping our early stages of recovery book   Does not apply Once Bhagat, Srishti L, MD      . amLODipine (NORVASC) tablet 10 mg  10 mg Oral Daily Agarwala, Ravi, MD      . aspirin chewable tablet 81 mg  81 mg Oral Daily Bhagat, Srishti L, MD   81 mg at 08/02/20 0508  . carvedilol (COREG) tablet 25 mg  25 mg Oral BID WC Lynnell Catalan, MD   25 mg at 08/02/20 0802  . Chlorhexidine Gluconate Cloth 2 % PADS 6 each  6 each Topical Daily Lynnell Catalan, MD   6 each at 08/02/20 0541  . clevidipine (CLEVIPREX) infusion 0.5 mg/mL  0-21 mg/hr Intravenous Continuous Curatolo, Adam, DO   Stopped at 08/02/20 [phone]  . hydrALAZINE (APRESOLINE) tablet 50 mg  50 mg Oral BID Lynnell Catalan, MD   50 mg at 08/02/20 0802  . hydrochlorothiazide (MICROZIDE) capsule 12.5 mg  12.5 mg Oral Daily Lynnell Catalan, MD      .  HYDROcodone-acetaminophen (NORCO/VICODIN) 5-325 MG per tablet 1 tablet  1 tablet Oral Q6H PRN Lynnell Catalan, MD      . tamsulosin (FLOMAX) capsule 0.4 mg  0.4 mg Oral Daily Lynnell Catalan, MD          Allergies  Allergen Reactions  . Aspirin     Pt has hemophilia   . Dilaudid [Hydromorphone Hcl]     Stomach upset.  :  Family History  Problem Relation Age of Onset  . Hypertension Other   :  Social History   Socioeconomic History  . Marital status: Divorced    Spouse name: Not on file  . Number of children: Not on file  . Years of education: Not on file  . Highest education  level: Not on file  Occupational History  . Not on file  Tobacco Use  . Smoking status: Never Smoker  . Smokeless tobacco: Never Used  Vaping Use  . Vaping Use: Never used  Substance and Sexual Activity  . Alcohol use: No  . Drug use: No  . Sexual activity: Yes  Other Topics Concern  . Not on file  Social History Narrative  . Not on file   Social Determinants of Health   Financial Resource Strain: Not on file  Food Insecurity: Not on file  Transportation Needs: Not on file  Physical Activity: Not on file  Stress: Not on file  Social Connections: Not on file  Intimate Partner Violence: Not on file  :  REVIEW OF SYSTEMS:  A comprehensive 14 point review of systems was negative except as noted in the HPI.    Exam: Patient Vitals for the past 24 hrs:  BP Temp Temp src Pulse Resp SpO2 Height Weight  08/02/20 0718 -- 97.8 F (36.6 C) Oral -- -- -- -- --  08/02/20 0700 (!) 193/99 -- -- 74 12 93 % -- --  08/02/20 0645 (!) 165/90 -- -- 73 13 94 % -- --  08/02/20 0630 (!) 160/89 -- -- 76 20 95 % -- --  08/02/20 0615 (!) 157/80 -- -- 71 10 93 % -- --  08/02/20 0600 (!) 141/72 -- -- 71 18 94 %  (1.676 m) 92.6 kg  08/02/20 0545 (!) 150/81 -- -- 73 13 95 % -- --  08/02/20 0541 -- 97.9 F (36.6 C) Oral -- -- -- -- --  08/02/20 0534 (!) 165/82 -- -- 75 16 95 % -- --  08/02/20 0445 (!) 153/80 -- -- -- (!) 24 -- -- --  08/02/20 0430 (!) 170/82 -- -- -- 19 -- -- --  08/02/20 0415 (!) 178/82 -- -- -- 18 -- -- --  08/02/20 0400 (!) 198/99 -- -- -- 14 -- -- --  08/02/20 0300 -- -- -- -- -- --  (1.676 m) 98.8 kg  08/02/20 0105 (!) 206/114 98.4 F (36.9 C) Oral 68 14 97 % -- --    General:  well-nourished in no acute distress.   Eyes:  no scleral icterus.   ENT:  There were no oropharyngeal lesions.   Lymphatics:  Negative cervical, supraclavicular or axillary adenopathy.   Respiratory: lungs were clear bilaterally without wheezing or crackles.   Cardiovascular:  Regular  rate and rhythm, S1/S2, without murmur, rub or gallop.  There was no pedal edema.   GI:  abdomen was soft, flat, nontender, nondistended, without organomegaly.   Skin exam was without ecchymosis, petechiae.   Neuro exam was nonfocal.  Patient was alert and oriented.  Attention was good.   Language was appropriate.  Mood was normal without depression.  Speech was not pressured.  Thought content was not tangential.    LABS:  Lab Results  Component Value Date   WBC 9.6 08/02/2020   HGB 17.9 (H) 08/02/2020   HCT 53.1 (H) 08/02/2020   PLT 146 (L) 08/02/2020   GLUCOSE 129 (H) 08/02/2020   CHOL 137 08/02/2020   TRIG 110 08/02/2020   HDL 50 08/02/2020   LDLCALC 65 08/02/2020   ALT 27 08/02/2020   AST 23 08/02/2020   NA 137 08/02/2020   K 4.0 08/02/2020   CL 105 08/02/2020   CREATININE 1.49 (H) 08/02/2020   BUN 22 (H) 08/02/2020   CO2 22 08/02/2020   INR 1.1 08/02/2020   HGBA1C 6.3 (H) 08/02/2020    MR ANGIO HEAD WO CONTRAST  Result Date: 08/02/2020 CLINICAL DATA:  TIA.  Right-sided numbness and difficulty walking. EXAM: MRI HEAD WITH CONTRAST MRA HEAD WITHOUT CONTRAST MRA NECK WITHOUT AND WITH CONTRAST TECHNIQUE: Multiplanar, multiecho pulse sequences of the brain and surrounding structures were obtained with intravenous contrast. Angiographic images of the Circle of Willis were obtained using MRA technique without intravenous contrast. Angiographic images of the neck were obtained using MRA technique without and with intravenous contrast. Carotid stenosis measurements (when applicable) are obtained utilizing NASCET criteria, using the distal internal carotid diameter as the denominator. CONTRAST:  9mL GADAVIST GADOBUTROL 1 MMOL/ML IV SOLN COMPARISON:  Noncontrast head MRI 08/02/2020 FINDINGS: MRI HEAD FINDINGS No abnormal brain parenchymal or meningeal enhancement is identified. The major dural venous sinuses are enhancing. MRA HEAD FINDINGS The intracranial vertebral arteries are widely  patent to the basilar. Patent left PICA, right AICA, and bilateral SCA origins are visualized. The basilar artery is widely patent. Posterior communicating arteries are diminutive or absent. Both PCAs are patent without evidence of a significant proximal stenosis. The internal carotid arteries are widely patent from skull base to carotid termini. ACAs and MCAs are patent without evidence of a proximal branch occlusion or significant proximal stenosis. No aneurysm is identified. MRA NECK FINDINGS There is motion artifact through the upper chest and lower neck. The brachiocephalic and subclavian arteries are patent without evidence of a flow limiting stenosis. The common carotid arteries and carotid bifurcations are widely patent. The mid and distal portions of the cervical internal carotid arteries were not included on the contrast-enhanced MRA, however they are patent without evidence of a significant stenosis or dissection on the noncontrast time-of-flight MRA. The vertebral arteries are patent and codominant with antegrade flow bilaterally. No significant vertebral artery stenosis or dissection is identified. IMPRESSION: 1. No abnormal intracranial enhancement. 2. Negative head MRA. 3. Negative neck MRA. Electronically Signed   By: Sebastian AcheAllen  Grady M.D.   On: 08/02/2020 10:00   MR ANGIO NECK W WO CONTRAST  Result Date: 08/02/2020 CLINICAL DATA:  TIA.  Right-sided numbness and difficulty walking. EXAM: MRI HEAD WITH CONTRAST MRA HEAD WITHOUT CONTRAST MRA NECK WITHOUT AND WITH CONTRAST TECHNIQUE: Multiplanar, multiecho pulse sequences of the brain and surrounding structures were obtained with intravenous contrast. Angiographic images of the Circle of Willis were obtained using MRA technique without intravenous contrast. Angiographic images of the neck were obtained using MRA technique without and with intravenous contrast. Carotid stenosis measurements (when applicable) are obtained utilizing NASCET criteria, using  the distal internal carotid diameter as the denominator. CONTRAST:  9mL GADAVIST GADOBUTROL 1 MMOL/ML IV SOLN COMPARISON:  Noncontrast head MRI 08/02/2020 FINDINGS: MRI HEAD FINDINGS  No abnormal brain parenchymal or meningeal enhancement is identified. The major dural venous sinuses are enhancing. MRA HEAD FINDINGS The intracranial vertebral arteries are widely patent to the basilar. Patent left PICA, right AICA, and bilateral SCA origins are visualized. The basilar artery is widely patent. Posterior communicating arteries are diminutive or absent. Both PCAs are patent without evidence of a significant proximal stenosis. The internal carotid arteries are widely patent from skull base to carotid termini. ACAs and MCAs are patent without evidence of a proximal branch occlusion or significant proximal stenosis. No aneurysm is identified. MRA NECK FINDINGS There is motion artifact through the upper chest and lower neck. The brachiocephalic and subclavian arteries are patent without evidence of a flow limiting stenosis. The common carotid arteries and carotid bifurcations are widely patent. The mid and distal portions of the cervical internal carotid arteries were not included on the contrast-enhanced MRA, however they are patent without evidence of a significant stenosis or dissection on the noncontrast time-of-flight MRA. The vertebral arteries are patent and codominant with antegrade flow bilaterally. No significant vertebral artery stenosis or dissection is identified. IMPRESSION: 1. No abnormal intracranial enhancement. 2. Negative head MRA. 3. Negative neck MRA. Electronically Signed   By: Sebastian Ache M.D.   On: 08/02/2020 10:00   MR BRAIN WO CONTRAST  Result Date: 08/02/2020 CLINICAL DATA:  Code stroke follow-up EXAM: MRI HEAD WITHOUT CONTRAST TECHNIQUE: Multiplanar, multiecho pulse sequences of the brain and surrounding structures were obtained without intravenous contrast. COMPARISON:  None. FINDINGS:  Brain: No acute infarct, mass effect or extra-axial collection. No acute or chronic hemorrhage. There is multifocal hyperintense T2-weighted signal within the white matter. Parenchymal volume and CSF spaces are normal. The midline structures are normal. Vascular: Major flow voids are preserved. Skull and upper cervical spine: Normal calvarium and skull base. Visualized upper cervical spine and soft tissues are normal. Sinuses/Orbits:No paranasal sinus fluid levels or advanced mucosal thickening. No mastoid or middle ear effusion. Normal orbits. IMPRESSION: 1. No acute intracranial abnormality. 2. Multifocal hyperintense T2-weighted signal within the white matter, most commonly seen in the setting of chronic small vessel ischemia. Electronically Signed   By: Deatra Robinson M.D.   On: 08/02/2020 02:34   MR BRAIN W CONTRAST  Result Date: 08/02/2020 CLINICAL DATA:  TIA.  Right-sided numbness and difficulty walking. EXAM: MRI HEAD WITH CONTRAST MRA HEAD WITHOUT CONTRAST MRA NECK WITHOUT AND WITH CONTRAST TECHNIQUE: Multiplanar, multiecho pulse sequences of the brain and surrounding structures were obtained with intravenous contrast. Angiographic images of the Circle of Willis were obtained using MRA technique without intravenous contrast. Angiographic images of the neck were obtained using MRA technique without and with intravenous contrast. Carotid stenosis measurements (when applicable) are obtained utilizing NASCET criteria, using the distal internal carotid diameter as the denominator. CONTRAST:  10mL GADAVIST GADOBUTROL 1 MMOL/ML IV SOLN COMPARISON:  Noncontrast head MRI 08/02/2020 FINDINGS: MRI HEAD FINDINGS No abnormal brain parenchymal or meningeal enhancement is identified. The major dural venous sinuses are enhancing. MRA HEAD FINDINGS The intracranial vertebral arteries are widely patent to the basilar. Patent left PICA, right AICA, and bilateral SCA origins are visualized. The basilar artery is widely  patent. Posterior communicating arteries are diminutive or absent. Both PCAs are patent without evidence of a significant proximal stenosis. The internal carotid arteries are widely patent from skull base to carotid termini. ACAs and MCAs are patent without evidence of a proximal branch occlusion or significant proximal stenosis. No aneurysm is identified. MRA NECK FINDINGS There is  motion artifact through the upper chest and lower neck. The brachiocephalic and subclavian arteries are patent without evidence of a flow limiting stenosis. The common carotid arteries and carotid bifurcations are widely patent. The mid and distal portions of the cervical internal carotid arteries were not included on the contrast-enhanced MRA, however they are patent without evidence of a significant stenosis or dissection on the noncontrast time-of-flight MRA. The vertebral arteries are patent and codominant with antegrade flow bilaterally. No significant vertebral artery stenosis or dissection is identified. IMPRESSION: 1. No abnormal intracranial enhancement. 2. Negative head MRA. 3. Negative neck MRA. Electronically Signed   By: Sebastian Ache M.D.   On: 08/02/2020 10:00   CT HEAD CODE STROKE WO CONTRAST  Result Date: 08/02/2020 CLINICAL DATA:  Code stroke.  Left-sided numbness EXAM: CT HEAD WITHOUT CONTRAST TECHNIQUE: Contiguous axial images were obtained from the base of the skull through the vertex without intravenous contrast. COMPARISON:  None. FINDINGS: Brain: There is no mass, hemorrhage or extra-axial collection. The size and configuration of the ventricles and extra-axial CSF spaces are normal. The brain parenchyma is normal, without evidence of acute or chronic infarction. Vascular: No abnormal hyperdensity of the major intracranial arteries or dural venous sinuses. No intracranial atherosclerosis. Skull: The visualized skull base, calvarium and extracranial soft tissues are normal. Sinuses/Orbits: No fluid levels or  advanced mucosal thickening of the visualized paranasal sinuses. No mastoid or middle ear effusion. The orbits are normal. ASPECTS [facility] Stroke Program Early CT Score) - Ganglionic level infarction (caudate, lentiform nuclei, internal capsule, insula, M1-M3 cortex): 7 - Supraganglionic infarction (M4-M6 cortex): 3 Total score (0-10 with 10 being normal): 10 IMPRESSION: 1. Normal head CT. 2. ASPECTS is 10. These results were communicated to Dr. Brooke Dare at 1:33 am on 08/02/2020 by text page via the [facility] messaging system. Electronically Signed   By: Deatra Robinson M.D.   On: 08/02/2020 01:33     ASSESSMENT AND PLAN:  This is a 50 year old male with  1) h/o congenital hemophilia A and h/o acquired factor inhibitor  -Patient was last seen for his hemophilia at [facility] by Dr Donia Guiles on 11/22/2019  Based on notes DX: history of Mild FVIII deficiency (historical baseline levels 11-22%, occasional levels approaching or exceeding 50%).   Treatment: On-demand treatment (rare use).   Also, with Development of an acquired inhibitor on or about 09/10/2010 With maximum titer of 88 BU on 10/08/10 and resolution in Dec of 2012. NovoSeven 90 micrograms/kg q 2 hours prn bleed. Immunosuppression with Rituximab for 2 doses last given 10/29/10.  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  Recommendation -PTT on admission 42 -would not recommend use of thrombolytics due to significantly increased risk of catastrophic systemic bleeding -- especially difficult to justify in light of resolving neurological symptoms -there aren't any standard guidelines on use of antiplatelet therapy for CVA in the setting of hemophilia A +/- inhibitors. There shall be some increased risk of bleeding and outside records have ASA listing as "allergy" for bleeding tendency. - in  this patients context with mild factor VIII deficiency with last know fviii levels of nearly 50% and baseline >5% and eradicated acquired inhibitor and limited h/o of recent spontaneous bleeding with mild phenotype -- risk vs benefit evaluation suggest it might be reasonable to consider short term acute use of single antiplatelet therapy with baby  ASA 81mg  po daily in the context of acute significant CVA that has potential to lead to debilitating neurodeficits. -discussed with Dr that control of his severe HTN shall be key in reducing his bleeding risk from Hemophilia as well as antiplatelet therapy. -patient would probably not be a good candidate for long term anti-platelet therapy(especially since he is not on prophylactic factor replacement) and will need to f/u with his hematologist Dr Iver Nestle at [facility] soon after hospital discharge to have this discussion. -will get rpt FVIII levels, PTT mixing study to evaluate for inhibitor and FVIII inhibitor assay.-- to determine current factor levels and presence of inhibitor. This would guide factor replacement if there were to be significant bleeding. Would use Advate if no inhibitor .  For catastrophic life-threatening bleeds if these labs are not back might need to treat with recombinant factor VII. -Patient's free T4 level is 2.91 which is significantly elevated.  His untreated uncontrolled Graves' disease puts him at higher risk of hypertensive crisis does need to be addressed to his risk of hypertensive bleeds.   Thank you for this referral. Plz call if questions.  [facility], DNP, AGPCNP-BC, AOCNP   ADDENDUM  .Patient was Personally and independently interviewed, examined and relevant elements of the history of present illness were reviewed in details and an assessment and plan was created. All elements of the patient's history of present illness , assessment and plan were discussed in details with Earl Pare, DNP,  AGPCNP-BC, AOCNP. The above documentation reflects our combined findings assessment and plan.  Earl Pare MD MS

## 2020-08-02 NOTE — H&P (Signed)
NAME:  Earl Williams, MRN:  [id number], DOB:  1970-05-25, LOS: 0 ADMISSION DATE:  08/02/2020, CONSULTATION DATE:  08/02/2020 REFERRING MD:  Lockie Mola- ED [facility], CHIEF COMPLAINT:  Hypertensive urgency.   History of Present Illness:  50 year old man who presented with right sided numbness and noted to have SBP to 250.   Symptoms lasted 1h and resolved with BP control with cleviprex.  He denies any other symptoms. No chest pain, dyspnea, headache or blurred vision.   Reports having missed evening dose of anti-hypertensive medications.  Pertinent  Medical History   Past Medical History:  Diagnosis Date  . Dyspnea   . Graves' disease   . Hemophilia (HCC)   . HLD (hyperlipidemia)   . HTN (hypertension)   . Kidney stone   . OSA on CPAP      Significant Hospital Events: Including procedures, antibiotic start and stop dates in addition to other pertinent events   . 5/20 - MRI shows no stroke . 5/20 - admitted for hypertensive urgency   Interim History / Subjective:  Feels better now with no numbness.   Objective   Blood pressure (!) 206/114, pulse 68, temperature 98.4 F (36.9 C), temperature source Oral, resp. rate 14, height 5\' 6"  (1.676 m), weight 98.8 kg, SpO2 97 %.       No intake or output data in the 24 hours ending 08/02/20 0343 Filed Weights   08/02/20 0300  Weight: 98.8 kg    Examination: General: average build, appears stated age.  HENT: normal thyroid. Lungs: chest clear  Cardiovascular: S4 present no murmurs. No JVD.  Abdomen: soft, no bruit Extremities: mild edema Neuro: sensorium clear, speech fluent, CN's intact, sensation and motor normal  GU: no foley.   Labs/imaging that I havepersonally reviewed  (right click and "Reselect all SmartList Selections" daily)  Mild elevated creatinine.  MRI shows small vessel disease notably in left thalamus.   Resolved Hospital Problem list   Right sided numbness.  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  Plan:   - Cleviprex infusion to initially reduce BP to systolic 170-190 - Resume home medication  - Can transfer out of ICU once BP controlled off infusions - Secondary stroke prevention work-up.   Best practice (right click and "Reselect all SmartList Selections" daily)  Diet:  Oral Pain/Anxiety/Delirium protocol (if indicated): No VAP protocol (if indicated): Not indicated DVT prophylaxis: Contraindicated GI prophylaxis: N/A Glucose control:  SSI No Central venous access:  N/A Arterial line:  N/A Foley:  N/A Mobility:  OOB  PT consulted: N/A Last date of multidisciplinary goals of care discussion [patient updated. ] Code Status:  full code Disposition: ICU  Labs   CBC: Recent Labs  Lab 08/02/20 0121 08/02/20 0128  WBC 8.7  --   NEUTROABS 3.9  --   HGB 15.4 15.0  HCT 45.4 44.0  MCV 92.3  --   PLT 141*  --     Basic Metabolic Panel: Recent Labs  Lab 08/02/20 0121 08/02/20 0128  NA 136 139  K 3.9 3.9  CL 105 105  CO2 24  --   GLUCOSE 105* 98  BUN 24* 24*  CREATININE 1.61* 1.50*  CALCIUM 8.6*  --    GFR: Estimated Creatinine Clearance: 64.8 mL/min (A) (by C-G formula based on SCr of 1.5 mg/dL (H)). Recent Labs  Lab 08/02/20  0121  WBC 8.7    Liver Function Tests: Recent Labs  Lab 08/02/20 0121  AST 23  ALT 27  ALKPHOS 111  BILITOT 1.3*  PROT 6.0*  ALBUMIN 3.4*   No results for input(s): LIPASE, AMYLASE in the last 168 hours. No results for input(s): AMMONIA in the last 168 hours.  ABG    Component Value Date/Time   TCO2 24 08/02/2020 0128     Coagulation Profile: Recent Labs  Lab 08/02/20 0121  INR 1.1    Cardiac Enzymes: No results for input(s): CKTOTAL, CKMB, CKMBINDEX, TROPONINI in the last 168 hours.  HbA1C: Hgb A1c MFr Bld  Date/Time Value Ref Range  Status  09/04/2010 11:40 PM 6.9 (H) <5.7 % Final    Comment:    (NOTE)                                                                       According to the ADA Clinical Practice Recommendations for 2011, when HbA1c is used as a screening test:  >=6.5%   Diagnostic of Diabetes Mellitus           (if abnormal result is confirmed) 5.7-6.4%   Increased risk of developing Diabetes Mellitus References:Diagnosis and Classification of Diabetes Mellitus,Diabetes Care,2011,34(Suppl 1):S62-S69 and Standards of Medical Care in         Diabetes - 2011,Diabetes Care,2011,34 (Suppl 1):S11-S61.    CBG: No results for input(s): GLUCAP in the last 168 hours.  Review of Systems:   Review of Systems  Constitutional: Negative.   HENT: Negative.   Eyes: Negative for blurred vision.  Respiratory: Negative.   Cardiovascular: Positive for leg swelling. Negative for chest pain.  Gastrointestinal: Negative.   Genitourinary: Negative.   Musculoskeletal: Negative.   Skin: Negative.   Neurological: Positive for tingling.  Endo/Heme/Allergies: Bruises/bleeds easily.  Psychiatric/Behavioral: Negative.      Past Medical History:  He,  has a past medical history of Dyspnea, Graves' disease, Hemophilia (HCC), HLD (hyperlipidemia), HTN (hypertension), Kidney stone, and OSA on CPAP.   Surgical History:   Past Surgical History:  Procedure Laterality Date  . TONSILLECTOMY       Social History:   reports that he has never smoked. He has never used smokeless tobacco. He reports that he does not drink alcohol and does not use drugs.   Family History:  His family history includes Hypertension in an other family member.   Allergies Allergies  Allergen Reactions  . Aspirin     Pt has hemophilia   . Dilaudid [Hydromorphone Hcl]     Stomach upset.     Home Medications  Prior to Admission medications   Medication Sig Start Date End Date Taking? Authorizing Provider  amLODipine (NORVASC) 5 MG tablet  Take 1 tablet (5 mg total) by mouth daily. Patient not taking: Reported on 06/09/2018 09/22/16   Regalado, Jon Billings A, MD  CALCIUM PO Take 2 tablets by mouth daily.    [provider]  carvedilol (COREG) 12.5 MG tablet Take 1 tablet (12.5 mg total) by mouth 2 (two) times daily with a meal. 09/21/16   Regalado, Belkys A, MD  CYANOCOBALAMIN PO Take 2 tablets by mouth daily.    [provider]  furosemide (LASIX) 40  MG tablet Take 1 tablet (40 mg total) by mouth daily. Patient not taking: Reported on 06/09/2018 09/22/16   Regalado, Jon Billings A, MD  hydrALAZINE (APRESOLINE) 50 MG tablet Take 1 tablet (50 mg total) by mouth 2 (two) times daily. 09/21/16   Regalado, Belkys A, MD  HYDROcodone-acetaminophen (NORCO/VICODIN) 5-325 MG tablet Take 1 tablet by mouth every 6 (six) hours as needed. Patient not taking: Reported on 06/09/2018 12/30/16   Kellie Shropshire, PA-C  MAGNESIUM PO Take 2 tablets by mouth daily.    [provider]  ondansetron (ZOFRAN) 4 MG tablet Take 1 tablet (4 mg total) by mouth every 8 (eight) hours as needed for nausea or vomiting. 12/30/16   Kellie Shropshire, PA-C  tamsulosin (FLOMAX) 0.4 MG CAPS capsule Take 1 capsule (0.4 mg total) by mouth daily. Patient not taking: Reported on 06/09/2018 12/30/16   Kellie Shropshire, PA-C    CRITICAL CARE Performed by: Lynnell Catalan   Total critical care time: 40 minutes  Critical care time was exclusive of separately billable procedures and treating other patients.  Critical care was necessary to treat or prevent imminent or life-threatening deterioration.  Critical care was time spent personally by me on the following activities: development of treatment plan with patient and/or surrogate as well as nursing, discussions with consultants, evaluation of patient's response to treatment, examination of patient, obtaining history from patient or surrogate, ordering and performing treatments and interventions, ordering and  review of laboratory studies, ordering and review of radiographic studies, pulse oximetry, re-evaluation of patient's condition and participation in multidisciplinary rounds.  Lynnell Catalan, MD [facility] ICU Physician [facility] [place]  Pager: [phone] Mobile: [phone] After hours: [phone].

## 2020-08-02 NOTE — ED Provider Notes (Signed)
[facility] EMERGENCY DEPARTMENT Provider Note   CSN: [id number] Arrival date & time: 08/02/20  0100     History No chief complaint on file.   Earl Williams is a 50 y.o. male.  Patient to ED with c/o right arm and right leg numbness described as decreased sensation, tingling. No weakness. No headache, visual change, nausea, facial droop or difficulty speaking. History of HTN, HLD, hemophilia, Graves. Symptoms started about one hour ago. CODE STROKE activated.   The history is provided by the patient. No language interpreter was used.       Past Medical History:  Diagnosis Date  . Dyspnea   . Graves' disease   . Hemophilia (HCC)   . HLD (hyperlipidemia)   . HTN (hypertension)   . Kidney stone   . OSA on CPAP     Patient Active Problem List   Diagnosis Date Noted  . CKD (chronic kidney disease) stage 2, GFR 60-89 ml/min 09/20/2016  . Obesity (BMI 30-39.9) 09/20/2016  . Ascending aortic aneurysm (HCC) 09/20/2016  . 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 Hyperlipidemia 05/04/2008  . Hypertensive heart disease without CHF 05/04/2008  . Obstructive sleep apnea 04/13/2008    Past Surgical History:  Procedure Laterality Date  . TONSILLECTOMY         Family History  Problem Relation Age of Onset  . Hypertension Other     Social History   Tobacco Use  . Smoking status: Never Smoker  . Smokeless tobacco: Never Used  Vaping Use  . Vaping Use: Never used  Substance Use Topics  . Alcohol use: No  . Drug use: No    Home Medications Prior to Admission medications   Medication Sig Start Date End Date Taking? Authorizing Provider  amLODipine (NORVASC) 5 MG tablet Take 1 tablet (5 mg total) by mouth daily. Patient not taking: Reported on 06/09/2018 09/22/16   Regalado, Jon Billings A, MD  CALCIUM PO Take 2 tablets by mouth daily.    [provider]  carvedilol (COREG) 12.5 MG tablet Take 1 tablet (12.5 mg total) by mouth 2 (two) times  daily with a meal. 09/21/16   Regalado, Belkys A, MD  CYANOCOBALAMIN PO Take 2 tablets by mouth daily.    [provider]  furosemide (LASIX) 40 MG tablet Take 1 tablet (40 mg total) by mouth daily. Patient not taking: Reported on 06/09/2018 09/22/16   Regalado, Jon Billings A, MD  hydrALAZINE (APRESOLINE) 50 MG tablet Take 1 tablet (50 mg total) by mouth 2 (two) times daily. 09/21/16   Regalado, Belkys A, MD  HYDROcodone-acetaminophen (NORCO/VICODIN) 5-325 MG tablet Take 1 tablet by mouth every 6 (six) hours as needed. Patient not taking: Reported on 06/09/2018 12/30/16   Kellie Shropshire, PA-C  MAGNESIUM PO Take 2 tablets by mouth daily.    [provider]  ondansetron (ZOFRAN) 4 MG tablet Take 1 tablet (4 mg total) by mouth every 8 (eight) hours as needed for nausea or vomiting. 12/30/16   Kellie Shropshire, PA-C  tamsulosin (FLOMAX) 0.4 MG CAPS capsule Take 1 capsule (0.4 mg total) by mouth daily. Patient not taking: Reported on 06/09/2018 12/30/16   Kellie Shropshire, PA-C    Allergies    Aspirin and Dilaudid [hydromorphone hcl]  Review of Systems   Review of Systems  Constitutional: Negative for chills and fever.  HENT: Negative.   Respiratory: Negative.   Cardiovascular: Negative.   Gastrointestinal: Negative.   Musculoskeletal: Negative.   Skin: Negative.  Neurological: Positive for numbness. Negative for facial asymmetry, speech difficulty, weakness, light-headedness and headaches.    Physical Exam Updated Vital Signs BP (!) 206/114 (BP Location: Left Arm)   Pulse 68   Temp 98.4 F (36.9 C) (Oral)   Resp 14   SpO2 97%   Physical Exam Vitals and nursing note reviewed.  Constitutional:      Appearance: He is well-developed.  Cardiovascular:     Rate and Rhythm: Normal rate.  Pulmonary:     Effort: Pulmonary effort is normal.  Musculoskeletal:        General: Normal range of motion.     Cervical back: Normal range of motion.  Skin:    General: Skin is  warm and dry.  Neurological:     Mental Status: He is alert and oriented to person, place, and time.     Comments: No facial asymmetry CN's 3-12 grossly intact Symmetric strength bilaterally without deficit Decreased sensation to right UE, right LE to mid calf Speech clear and focused     ED Results / Procedures / Treatments   Labs (all labs ordered are listed, but only abnormal results are displayed) Labs Reviewed  RESP PANEL BY RT-PCR (FLU A&B, COVID) ARPGX2  ETHANOL  PROTIME-INR  APTT  CBC  DIFFERENTIAL  COMPREHENSIVE METABOLIC PANEL  RAPID URINE DRUG SCREEN, HOSP PERFORMED  URINALYSIS, ROUTINE W REFLEX MICROSCOPIC  I-STAT CHEM 8, ED    EKG None  Radiology No results found.  Procedures Procedures CRITICAL CARE Performed by: Arnoldo Hooker   Total critical care time: 50 minutes  Critical care time was exclusive of separately billable procedures and treating other patients.  Critical care was necessary to treat or prevent imminent or life-threatening deterioration.  Critical care was time spent personally by me on the following activities: development of treatment plan with patient and/or surrogate as well as nursing, discussions with consultants, evaluation of patient's response to treatment, examination of patient, obtaining history from patient or surrogate, ordering and performing treatments and interventions, ordering and review of laboratory studies, ordering and review of radiographic studies, pulse oximetry and re-evaluation of patient's condition.   Medications Ordered in ED Medications  hydrALAZINE (APRESOLINE) tablet 50 mg (has no administration in time range)    ED Course  I have reviewed the triage vital signs and the nursing notes.  Pertinent labs & imaging results that were available during my care of the patient were reviewed by me and considered in my medical decision making (see chart for details).    MDM Rules/Calculators/A&P                           Patient found to be significantly hypertensive on arrival at 206/114. He reports taking regular medications this am but not his afternoon dose of hydralazine. This was ordered on arrival.   Discussed the patient with neuro, Dr. Dierdre Harness. CODE STROKE activated. Patient taken directly to CT, labs pending. Neuro to see in CT.  Patient returns from CT, neuro at bedside, Dr. Dierdre Harness. TPA being considered for suspected thalamic stroke, however, in the setting of history of hemophilia A, pharmacy being consulted. MRI pending.    Per Dr. Dierdre Harness, after consultation with [facility] Hem/onc, he is not a TPA candidate. Ok to give ASA.  Factor levels pending as ordered by Dr. Dierdre Harness.   Cleviprex infusion started for blood pressure control. MRI normal. Patient reports symptoms have decreased over time. Dx TIA. Neuro to remain  as Research scientist (medical), ICU will be consulted for admission of hypertensive urgency - discussion by Dr. Lockie Mola with critical care who accepts the patient on to their service.      Final Clinical Impression(s) / ED Diagnoses Final diagnoses:  None   1. TIA 2. Hypertensive urgency  Rx / DC Orders ED Discharge Orders    None       Danne Harbor 08/02/20 0735    Virgina Norfolk, DO 08/02/20 1521

## 2020-08-02 NOTE — Evaluation (Signed)
Physical Therapy Evaluation and D/C Patient Details Name: Earl Williams MRN: [id number] DOB: [birth year] Today's Date: 08/02/2020   History of Present Illness  50 year old man admitted 5/20 with transient focal neurological deficit right hemibody in the setting of hypertensive emergency.  He required Cleviprex for blood pressure control.  MRI brain with question possible subtle diffusion abnormality in the left thalamic border.  Started on aspirin.  PMH:  HTN, Graves' disease (apparently untreated), hemophilia.  Clinical Impression  Pt admitted with above diagnosis. Pt was able to ambulate without device and progress distance on unit with independence. Pt reports only mild numbness right hemibody but it does not affect mobility at this point.  Pt has no skilled PT needs at this time. Will sign off.       Follow Up Recommendations No PT follow up    Equipment Recommendations  None recommended by PT    Recommendations for Other Services       Precautions / Restrictions Precautions Precautions: None Restrictions Weight Bearing Restrictions: No      Mobility  Bed Mobility Overal bed mobility: Independent                  Transfers Overall transfer level: Independent                  Ambulation/Gait Ambulation/Gait assistance: Independent Gait Distance (Feet): 1000 Feet Assistive device: None Gait Pattern/deviations: WFL(Within Functional Limits)   Gait velocity interpretation: >4.37 ft/sec, indicative of normal walking speed General Gait Details: No LOB and can accept challenges in all directions.  Stairs            Wheelchair Mobility    Modified Rankin (Stroke Patients Only) Modified Rankin (Stroke Patients Only) Pre-Morbid Rankin Score: No symptoms Modified Rankin: No significant disability     Balance Overall balance assessment: Independent                                           Pertinent Vitals/Pain Pain  Assessment: No/denies pain    Home Living Family/patient expects to be discharged to:: Private residence Living Arrangements: Parent Available Help at Discharge: Family;Available PRN/intermittently (mom 24 hours day- works from home) Type of Home: House Home Access: Level entry     Home Layout: One level Home Equipment: Bedside commode;Shower seat;Wheelchair - [employer] - standard (mom has equipment but noone is using currently.) Additional Comments: FT job PTA    Prior Function Level of Independence: Independent         Comments: Mom currently in hospital for 1 week     Hand Dominance   Dominant Hand: Right    Extremity/Trunk Assessment   Upper Extremity Assessment Upper Extremity Assessment: Overall WFL for tasks assessed    Lower Extremity Assessment Lower Extremity Assessment: RLE deficits/detail RLE Sensation: decreased light touch    Cervical / Trunk Assessment Cervical / Trunk Assessment: Normal  Communication   Communication: No difficulties  Cognition Arousal/Alertness: Awake/alert Behavior During Therapy: WFL for tasks assessed/performed Overall Cognitive Status: Within Functional Limits for tasks assessed                                        General Comments      Exercises     Assessment/Plan    PT Assessment Patent does  not need any further PT services  PT Problem List         PT Treatment Interventions      PT Goals (Current goals can be found in the Care Plan section)  Acute Rehab PT Goals Patient Stated Goal: to go home PT Goal Formulation: All assessment and education complete, DC therapy    Frequency     Barriers to discharge        Co-evaluation               AM-PAC PT "6 Clicks" Mobility  Outcome Measure Help needed turning from your back to your side while in a flat bed without using bedrails?: None Help needed moving from lying on your back to sitting on the side of a flat bed without using  bedrails?: None Help needed moving to and from a bed to a chair (including a wheelchair)?: None Help needed standing up from a chair using your arms (e.g., wheelchair or bedside chair)?: None Help needed to walk in hospital room?: None Help needed climbing 3-5 steps with a railing? : None 6 Click Score: 24    End of Session Equipment Utilized During Treatment: Gait belt Activity Tolerance: Patient tolerated treatment well Patient left: in chair;with call bell/phone within reach;with family/visitor present Nurse Communication: Mobility status PT Visit Diagnosis: Muscle weakness (generalized) (M62.81)    Time: 0109-3235 PT Time Calculation (min) (ACUTE ONLY): 18 min   Charges:   PT Evaluation $PT Eval Low Complexity: 1 Low          Kerin Kren M,PT Acute Rehab Services [phone] (pager)  Bevelyn Buckles 08/02/2020, 2:28 PM

## 2020-08-02 NOTE — Progress Notes (Signed)
NAME:  Earl Williams, MRN:  [id number], DOB:  Jul 24, 1970, LOS: 0 ADMISSION DATE:  08/02/2020, CONSULTATION DATE:  08/02/2020 REFERRING MD:  Lockie Mola- [facility], CHIEF COMPLAINT:  Hypertensive urgency.   History of Present Illness:  50 year old man who presented with right sided numbness and noted to have SBP to 250.   Symptoms lasted 1h and resolved with BP control with cleviprex.  He denies any other symptoms. No chest pain, dyspnea, headache or blurred vision.   Reports having missed evening dose of anti-hypertensive medications.  Pertinent  Medical History   Past Medical History:  Diagnosis Date  . Dyspnea   . Graves' disease   . Hemophilia (HCC)   . HLD (hyperlipidemia)   . HTN (hypertension)   . Kidney stone   . OSA on CPAP      Significant Hospital Events: Including procedures, antibiotic start and stop dates in addition to other pertinent events   . 5/20 - MRI shows no stroke . 5/20 - admitted for hypertensive urgency   Interim History / Subjective:  This morning pt reports he is still having right-sided tingling/numbness that has been on/off all night. No weakness. Denies chest pain, SOB, HA.  Objective   Blood pressure (!) 193/99, pulse 74, temperature 97.8 F (36.6 C), temperature source Oral, resp. rate 12, height 5\' 6"  (1.676 m), weight 92.6 kg, SpO2 93 %.        Intake/Output Summary (Last 24 hours) at 08/02/2020 0937 Last data filed at 08/02/2020 0631 Gross per 24 hour  Intake 28.16 ml  Output 350 ml  Net -321.84 ml   Filed Weights   08/02/20 0300 08/02/20 0600  Weight: 98.8 kg 92.6 kg    Examination: General: tired-appearing. Lying in bed. No acute distress. HENT: Moist mucus membranes. No visible neck masses or thyromegaly. Lungs: CTAB. Equal chest wall expansion bilaterally. Cardiovascular: Regular rate and rhythm. No murmurs, rubs, gallops. Abdomen: Soft, nontender. Extremities: Mild nonpitting edema in lower extremities Neuro: Alert  and oriented. Able move all 4 extremities. Normal strength throughout.  Labs/imaging that I havepersonally reviewed  (right click and "Reselect all SmartList Selections" daily)  Cr 1.49 TSH < 0.01 Free T4 2.91 APTT 42, PT 14, INR 1.1 EKG with new ST depressions in III, avf  Resolved Hospital Problem list     Assessment & Plan:   #Left lacunar stroke vs TIA: CT and MRI negative for acute intracranial abnormality or ischemia; however, on neurology's read of MRI there is concern for subtle diffusion abnormalities in left thalamic border/internal capsule and risk of symptoms worsening. Pt has diagnosis of hemophilia A and PTT mildly prolonged, so DAPT contraindicated. Hemology was ok with starting aspirin in short-term. Need to weigh risks of intracranial hemorrhage vs worsening ischemia in this patient, esp with diagnosis of hemophilia and prolonged PTT. Blood pressure goal 170-190 currently. P: - Neurology following, appreciate recommendations - Echo and MRA of brain and neck scheduled for today, will f/u results - Cont aspirin, hold other antiplt treatment due to bleeding risk   #Hypertensive emergency: Patient presented with systolic BP > 200. He was started on cleviprex drip overnight and systolic ranged from 150-190s. Cleviprex turned off this morning and patient started on po medications for BP. No other signs of end-organ damage; however new EKG changes may reflect ischemia. Kidney function stable. Hyperthyroid state may be contributing to hypertensive emergency picture, as well. P: - Check troponin today and repeat tomorrow due to EKG changes - Cont to hold  cleviprex. Can start amlodipine, carvedilol, hydralazine and HCTZ, but hold if BP below goal - Further thyroid testing (described below)  #Hyperthyroidism: Based on chart review, pt first diagnosed with Graves disease in 2012, but no antibody testing was performed at that time. He did have thyroid uptake scan that was within  normal limits. TSH low and free T4 is elevated, consistent with hyperthyroidism. Hyperthyroidism may be contributing to pt's uncontrolled hypertension. P: - Order antibody testing for Graves   Best practice (right click and "Reselect all SmartList Selections" daily)  Diet:  Oral Pain/Anxiety/Delirium protocol (if indicated): No VAP protocol (if indicated): Not indicated DVT prophylaxis: Contraindicated GI prophylaxis: N/A Glucose control:  SSI No Central venous access:  N/A Arterial line:  N/A Foley:  N/A Mobility:  OOB  PT consulted: N/A Last date of multidisciplinary goals of care discussion [patient updated. ] Code Status:  full code Disposition: ICU   Sallee Provencal, MS4

## 2020-08-02 NOTE — Code Documentation (Signed)
Paged for code stroke in ER that had already arrived and been scanned in room 29  Upon arrival patient extremely hypertensive 212/133.  LKW 2330 Complains of difficulty walking and right sided numbness.  Patient has hemophilia.  Patient was sent to MRI and possible stroke seen by [facility] but patient is a contraindication for tpa due to hemophilia.    NIH 1

## 2020-08-02 NOTE — Progress Notes (Signed)
OT Cancellation Note  Patient Details Name: Earl Williams MRN: [id number] DOB: 1970/07/03   Cancelled Treatment:    Reason Eval/Treat Not Completed: OT screened, no needs identified, will sign off. Per PT, patient able to ambulate independently without device. No focal weakness noted. Patient does not require skilled acute OT needs.   Kallie Edward OTR/L Supplemental OT, Department of rehab services [phone]  Chinara Hertzberg R H. 08/02/2020, 2:40 PM

## 2020-08-02 NOTE — TOC Initial Note (Signed)
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 Had some focal neuro changes neurology on consult. PT and OT assessment revealed  No needs or recommendations. Patient needs to follow up with PCP for BP control.   Expected Discharge Plan: Home/Self Care Barriers to Discharge: Continued Medical Work up   Patient Goals and CMS Choice        Expected Discharge Plan and Services Expected Discharge Plan: Home/Self Care   Discharge Planning Services: CM Consult   Living arrangements for the past 2 months: Single Family Home                                      Prior Living Arrangements/Services Living arrangements for the past 2 months: Single Family Home Lives with:: Significant Other Patient language and need for interpreter reviewed:: Yes        Need for Family Participation in Patient Care: Yes (Comment) Care giver support system in place?: Yes (comment)   Criminal Activity/Legal Involvement Pertinent to Current Situation/Hospitalization: No - Comment as needed  Activities of Daily Living Home Assistive Devices/Equipment: None ADL Screening (condition at time of admission) Patient's cognitive ability adequate to safely complete daily activities?: Yes Is the patient deaf or have difficulty hearing?: No Does the patient have difficulty seeing, even when wearing glasses/contacts?: No Does the patient have difficulty concentrating, remembering, or making decisions?: No Patient able to express need for assistance with ADLs?: No Does the patient have difficulty dressing or bathing?: No Independently performs ADLs?: Yes  (appropriate for developmental age) Does the patient have difficulty walking or climbing stairs?: No Weakness of Legs: Right (acute only) Weakness of Arms/Hands: Right (acute only)  Permission Sought/Granted                  Emotional Assessment       Orientation: : Oriented to Situation,Oriented to  Time,Oriented to Place,Oriented to Self Alcohol / Substance Use: Not Applicable Psych Involvement: No (comment)  Admission diagnosis:  TIA (transient ischemic attack) [G45.9] Hypertensive emergency, no CHF [I16.1] Hypertensive emergency [I16.1] Patient Active Problem List   Diagnosis Date Noted  . Hypertensive emergency 08/02/2020  . TIA (transient ischemic attack)   . CKD (chronic kidney disease) stage 2, GFR 60-89 ml/min 09/20/2016  . Obesity (BMI 30-39.9) 09/20/2016  . Ascending aortic aneurysm (HCC) 09/20/2016  . 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 Hyperlipidemia 05/04/2008  . Hypertensive heart disease without CHF 05/04/2008  . Obstructive sleep apnea 04/13/2008   PCP:  Fleet Contras, MD Pharmacy:   [facility] [phone] [address], [place] - [phone] AT [facility] [address] [place] [address] Phone: [phone] Fax: [phone]     Social Determinants of Health (SDOH) Interventions    Readmission Risk Interventions No flowsheet data found.

## 2020-08-02 NOTE — Progress Notes (Addendum)
STROKE TEAM PROGRESS NOTE   INTERVAL HISTORY No acute events since arrival Waxing and waning symptoms continue consisting of right sided numbness worst in upper extremity. He reports he is exhausted due to lack of sleep overnight but otherwise feeling okay. Sig other voices concern that he does not take care of himself as he should. We discussed his ongoing work up, concern for stroke and plan of care. He was quite drowsy (easily arousable) and not really participating in discussion for which he apologized because he was too tired to pay attention. Will reiterate. He had no questions.   Vitals:   08/02/20 0630 08/02/20 0645 08/02/20 0700 08/02/20 0718  BP: (!) 160/89 (!) 165/90 (!) 193/99   Pulse: 76 73 74   Resp: 20 13 12    Temp:    97.8 F (36.6 C)  TempSrc:    Oral  SpO2: 95% 94% 93%   Weight:      Height:       CBC:  Recent Labs  Lab 08/02/20 0121 08/02/20 0128 08/02/20 0440  WBC 8.7  --  9.6  NEUTROABS 3.9  --   --   HGB 15.4 15.0 17.9*  HCT 45.4 44.0 53.1*  MCV 92.3  --  92.5  PLT 141*  --  146*   Basic Metabolic Panel:  Recent Labs  Lab 08/02/20 0121 08/02/20 0128 08/02/20 0440  NA 136 139 137  K 3.9 3.9 4.0  CL 105 105 105  CO2 24  --  22  GLUCOSE 105* 98 129*  BUN 24* 24* 22*  CREATININE 1.61* 1.50* 1.49*  CALCIUM 8.6*  --  9.3   Lipid Panel:  Recent Labs  Lab 08/02/20 0323  CHOL 137  TRIG 110  HDL 50  CHOLHDL 2.7  VLDL 22  LDLCALC 65   HgbA1c:  Recent Labs  Lab 08/02/20 0440  HGBA1C 6.3*   Urine Drug Screen:  Recent Labs  Lab 08/02/20 0155  LABOPIA NONE DETECTED  COCAINSCRNUR NONE DETECTED  LABBENZ NONE DETECTED  AMPHETMU NONE DETECTED  THCU NONE DETECTED  LABBARB NONE DETECTED    Alcohol Level  Recent Labs  Lab 08/02/20 0120  ETH <10    IMAGING past 24 hours MR BRAIN WO CONTRAST  Result Date: 08/02/2020 CLINICAL DATA:  Code stroke follow-up EXAM: MRI HEAD WITHOUT CONTRAST TECHNIQUE: Multiplanar, multiecho pulse sequences  of the brain and surrounding structures were obtained without intravenous contrast. COMPARISON:  None. FINDINGS: Brain: No acute infarct, mass effect or extra-axial collection. No acute or chronic hemorrhage. There is multifocal hyperintense T2-weighted signal within the white matter. Parenchymal volume and CSF spaces are normal. The midline structures are normal. Vascular: Major flow voids are preserved. Skull and upper cervical spine: Normal calvarium and skull base. Visualized upper cervical spine and soft tissues are normal. Sinuses/Orbits:No paranasal sinus fluid levels or advanced mucosal thickening. No mastoid or middle ear effusion. Normal orbits. IMPRESSION: 1. No acute intracranial abnormality. 2. Multifocal hyperintense T2-weighted signal within the white matter, most commonly seen in the setting of chronic small vessel ischemia. Electronically Signed   By: 08/04/2020 M.D.   On: 08/02/2020 02:34   CT HEAD CODE STROKE WO CONTRAST  Result Date: 08/02/2020 CLINICAL DATA:  Code stroke.  Left-sided numbness EXAM: CT HEAD WITHOUT CONTRAST TECHNIQUE: Contiguous axial images were obtained from the base of the skull through the vertex without intravenous contrast. COMPARISON:  None. FINDINGS: Brain: There is no mass, hemorrhage or extra-axial collection. The size and configuration of  the ventricles and extra-axial CSF spaces are normal. The brain parenchyma is normal, without evidence of acute or chronic infarction. Vascular: No abnormal hyperdensity of the major intracranial arteries or dural venous sinuses. No intracranial atherosclerosis. Skull: The visualized skull base, calvarium and extracranial soft tissues are normal. Sinuses/Orbits: No fluid levels or advanced mucosal thickening of the visualized paranasal sinuses. No mastoid or middle ear effusion. The orbits are normal. ASPECTS [facility] Stroke Program Early CT Score) - Ganglionic level infarction (caudate, lentiform nuclei, internal capsule,  insula, M1-M3 cortex): 7 - Supraganglionic infarction (M4-M6 cortex): 3 Total score (0-10 with 10 being normal): 10 IMPRESSION: 1. Normal head CT. 2. ASPECTS is 10. These results were communicated to Dr. Brooke Dare at 1:33 am on 08/02/2020 by text page via the [facility] messaging system. Electronically Signed   By: Deatra Robinson M.D.   On: 08/02/2020 01:33   PHYSICAL EXAM Constitutional: Appears well-developed and well-nourished.  Psych: Affect appropriate to situation, calm and cooperative Eyes: No scleral injection HENT: No oropharyngeal obstruction.  MSK: no joint deformities.  Cardiovascular: Normal rate and regular rhythm.  Respiratory: Effort normal, non-labored breathing GI: Soft.  No distension. There is no tenderness.  Skin: Warm dry and intact visible skin  Neuro: Mental Status: Patient is drowsy, easily arouses to verbal stimuli,  oriented to person, place, month, year, and situation.  Patient is able to give a clear and coherent history. Speech is clear and fluent. Follows multistep commands briskly.  Cranial Nerves: II: Visual Fields are full. Pupils are equal, round, and reactive to light.   III,IV, VI: EOMI without ptosis or diploplia.  V: Facial sensation is symmetric to touch V1-V3 VII: Facial movement is symmetric.  VIII: hearing is intact to voice X: Uvula elevates symmetrically XI: Shoulder shrug is symmetric. XII: tongue is midline without atrophy or fasciculations.  Motor: Tone is normal. Bulk is normal.  There is no pronator drift and bilateral lower extremities were Sensory: Reduced sensation Right UE and LE.  Cerebellar: FNF and HKS are intact bilaterally Gait: Deferred   ASSESSMENT/PLAN [name] is a 50 y.o. male with history of  multiple small vessel risk factors uncontrolled hypertension, hyperlipidemia, OSA, obesity and also with  hemophilia A and uncontrolled Graves disease presenting with right-sided sensory symptoms  concerning for an acute ischemic lesion of the left thalamus/internal capsule.  MRI brain revealed no acute intracranial process per radiology however, Dr. Rollene Fare read as subtle diffusion restriction in the left thalamic border/internal capsule without FLAIR correlate. Fortunately after MRI brain his symptoms were rapidly resolving and he reported he felt at his baseline. His history of  hemophilia A especially in the setting of elevated PTT  put the patient at an unacceptably high risk of hemorrhage therefore tPA was not given.  Subjective right hemiparesthesia - ? left thalamic infarct vs. ? Hyperthyroidism-related - MRI repeat pending in am. Complicated by history hemophilia A limiting treatment options.    Code Stroke: CT head No acute abnormality.   MRI : No acute intracranial abnormality. ? Subtle diffusion restriction in left thalamic border  MRA head and neck normal   MRI with contrast - No abnormal intracranial enhancement.  Repeat MRI without contrast planned for am to rule in or rule out stroke  2D Echo: EF 70-75%  LDL 65  HgbA1c 6.3  VTE prophylaxis - None  No AC/AP prior to admission, currently on ASA 81mg  alone (risk vs. Benefit per hematology as below). If repeat MRI negative for stroke,  will d/c ASA.   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 Candise CheKale advised no thrombolytics and Risk vs. Benefit of antiplatelet therapy: reasonable to consider short term acute use of single antiplatelet therapy with baby ASA 81mg  po daily but patient would probably not be a good candidate for long term anti-platelet therapy(especially since he is not on prophylactic factor replacement) and will need to f/u with his hematologist   [id number]: close follow up needed   currently on ASA 81mg  alone. If repeat MRI negative for stroke, will d/c ASA.    Hyperthyroidism, uncontrolled  TSH < 0.010, FT4 2.91  Per pt, he was treated in 2016 and after 6 months of treatment, his thyroid test all normal so he was taken off the medication  Likely HTN on presentation associated with hyperthyroidism   Management per CCM  Hypertensive emergency, secondary  On amlodipine, norvasc, lasix, apresoline  Now off cleviprex infusion  Dr. Candise CheKale advises control of his severe HTN shall be key in reducing his bleeding risk from Hemophilia as well as antiplatelet therapy.  Uncontrolled Grave's disease contributing  . Long-term BP goal normotensive  Other Stroke Risk Factors  Obesity, Body mass index is 32.95 kg/m., BMI >/= 30 associated with increased stroke risk, recommend weight loss, diet and exercise as appropriate   Obstructive sleep apnea  Other Active Problems    Hospital day # 0  Delila A Bailey-Modzik, NP-C, MSN  ATTENDING NOTE: I reviewed above note and agree with the assessment and plan. Pt was seen and examined.   50 year old male with history of hypertension, hyperlipidemia, obesity, OSA, hemophilia A, hypothyroidism admitted for difficulty walking, right arm and leg numbness.  CT no acute abnormality.  MRI with and without contrast no acute finding.  MRA head and neck normal.  A1c 6.3, LDL 65.  EF 70 to 75%.  On presentation, his PTT 42.  Hematology consulted, patient not a candidate for thrombolytic treatment given bleeding risk.  Currently, hematology okay with aspirin 81, but against long-term use.  On presentation, patient was found to have a low TSH < 0.010, and elevated FT4 at 2.91.  Per patient, he had hyperthyroidism before and was treated in 2016, after brief treatment, his thyroid function back to normal, he was taken off the medication.  He also presented with hypertension, highest at 206/114.  Likely also related to uncontrolled hyperthyroidism.  Currently on multiple BP meds, blood pressure stable.  On exam,  patient neurologically intact including bilateral light touch sensation symmetrical.  However, he had a subjective numbness on the right arm and leg, resolved face involvement.  Etiology for subjective numbness not quite clear, will repeat MRI without contrast in a.m. to rule in or rule out stroke.  If MRI negative for stroke, aspirin 81 can be discontinued.  Continue further management per primary team for hyperthyroidism than and secondary hypertension.  Appreciate hematology consultation for hemophilia A.  For detailed assessment and plan, please refer to above as I have made changes wherever appropriate.   Marvel PlanJindong Jelene Albano, MD PhD Stroke Neurology 08/02/2020 5:41 PM  This patient is critically ill due to strokelike symptoms, uncontrolled hyperthyroidism, hypertensive emergency, hemophilia A and at significant risk of neurological worsening, death form bleeding, hypertensive encephalopathy, seizure, stroke. This patient's care requires constant monitoring of vital signs, hemodynamics, respiratory and cardiac monitoring, review of multiple databases, neurological  assessment, discussion with family, other specialists and medical decision making of high complexity. I spent 35 minutes of neurocritical care time in the care of this patient. I had long discussion with patient and wife at bedside, updated pt current condition, treatment plan and potential prognosis, and answered all the questions.  They expressed understanding and appreciation.      To contact Stroke Continuity provider, please refer to [email or web address]. After hours, contact General Neurology

## 2020-08-02 NOTE — Progress Notes (Signed)
Pt offered to store any belongings in safe and declined.  Declined to have this RN inventory monies in wallet in his presence.

## 2020-08-02 NOTE — Progress Notes (Signed)
Patient was transported to 3W in wheelchair on telemetry with RN. No change in Vital Signs or mental status. Receiving RN was notified and patient was connected to telemetry. Cell phone, charger and all belongings went with patient.

## 2020-08-02 NOTE — Progress Notes (Signed)
SLP Cancellation Note  Patient Details Name: Earl Williams MRN: [id number] DOB: February 10, 1971   Cancelled treatment:       Reason Eval/Treat Not Completed: SLP screened, no needs identified, will sign off. Pt did not present with any acute speech, language, or cognitive-linguistic deficits on admission, MRI was negative for acute changes, and no overt communication or cognitive-linguistic deficits were noted by physical therapy. A formal speech/language/cognition evaluation does not appear to be clinically indicated at this time. SLP will sign off.  Rich Paprocki I. Vear Clock, MS, CCC-SLP Acute Rehabilitation Services Office number [phone] Pager [phone]  Scheryl Marten 08/02/2020, 3:40 PM

## 2020-08-02 NOTE — ED Provider Notes (Signed)
I personally evaluated the patient during the encounter and completed a history, physical, procedures, medical decision making to contribute to the overall care of the patient and decision making for the patient briefly, the patient is a 50 y.o. male history of hemophilia A who presents the ED with right-sided numbness.  1 hour prior to arrival.  Hypertensive in the 250s.  Code stroke initiated upon evaluation.  Head CT is unremarkable.  MRI was normal but neurology suspects a left-sided thalamic stroke.  Symptoms have improved.  Neurology states that hematology is okay with aspirin at this time was not a tPA candidate due to hemophilia history.  Patient will require Cleviprex drip to maintain systolic blood pressure at goal.  To be admitted to the ICU.  Hemodynamically stable otherwise.  Numbness has improved.  This chart was dictated using voice recognition software.  Despite best efforts to proofread,  errors can occur which can change the documentation meaning.  .Critical Care Performed by: Virgina Norfolk, DO Authorized by: Virgina Norfolk, DO   Critical care provider statement:    Critical care time (minutes):  35   Critical care was necessary to treat or prevent imminent or life-threatening deterioration of the following conditions:  CNS failure or compromise   Critical care was time spent personally by me on the following activities:  Blood draw for specimens, development of treatment plan with patient or surrogate, discussions with consultants, discussions with primary provider, evaluation of patient's response to treatment, examination of patient, obtaining history from patient or surrogate, ordering and review of laboratory studies, ordering and performing treatments and interventions, ordering and review of radiographic studies, pulse oximetry, re-evaluation of patient's condition and review of old charts   I assumed direction of critical care for this patient from another provider in my  specialty: no        EKG Interpretation None           Virgina Norfolk, DO 08/02/20 [id number]

## 2020-08-02 NOTE — Plan of Care (Signed)
  Problem: Education: Goal: Knowledge of disease or condition will improve Outcome: Progressing Goal: Knowledge of secondary prevention will improve Outcome: Progressing Goal: Knowledge of patient specific risk factors addressed and post discharge goals established will improve Outcome: Progressing   Problem: Coping: Goal: Will verbalize positive feelings about self Outcome: Progressing Goal: Will identify appropriate support needs Outcome: Progressing   Problem: Health Behavior/Discharge Planning: Goal: Ability to manage health-related needs will improve Outcome: Progressing   Problem: Self-Care: Goal: Ability to participate in self-care as condition permits will improve Outcome: Progressing Goal: Verbalization of feelings and concerns over difficulty with self-care will improve Outcome: Progressing Goal: Ability to communicate needs accurately will improve Outcome: Progressing   Problem: Nutrition: Goal: Risk of aspiration will decrease Outcome: Progressing   Problem: Intracerebral Hemorrhage Tissue Perfusion: Goal: Complications of Intracerebral Hemorrhage will be minimized Outcome: Progressing   Problem: Ischemic Stroke/TIA Tissue Perfusion: Goal: Complications of ischemic stroke/TIA will be minimized Outcome: Progressing   Problem: Education: Goal: Knowledge of General Education information will improve Description: Including pain rating scale, medication(s)/side effects and non-pharmacologic comfort measures Outcome: Progressing   Problem: Health Behavior/Discharge Planning: Goal: Ability to manage health-related needs will improve Outcome: Progressing   Problem: Clinical Measurements: Goal: Ability to maintain clinical measurements within normal limits will improve Outcome: Progressing Goal: Will remain free from infection Outcome: Progressing Goal: Diagnostic test results will improve Outcome: Progressing Goal: Respiratory complications will  improve Outcome: Progressing Goal: Cardiovascular complication will be avoided Outcome: Progressing   Problem: Activity: Goal: Risk for activity intolerance will decrease Outcome: Progressing   Problem: Nutrition: Goal: Adequate nutrition will be maintained Outcome: Progressing   Problem: Coping: Goal: Level of anxiety will decrease Outcome: Progressing   Problem: Safety: Goal: Ability to remain free from injury will improve Outcome: Progressing   Problem: Skin Integrity: Goal: Risk for impaired skin integrity will decrease Outcome: Progressing

## 2020-08-02 NOTE — Progress Notes (Signed)
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 Dr Iver Nestle notes his neurological symptoms are rapidly improving but he is at risk for long standing thalamic pain syndrome that could be quite severe and that without adequate treatment his neurodeficits could worsen again. MRI brain - IMPRESSION: 1. No acute intracranial abnormality. 2. Multifocal hyperintense T2-weighted signal within the white matter, most commonly seen in the setting of chronic small vessel Ischemia.  Patient was last seen for his hemophilia at [facility] by Dr Donia Guiles on 9/8/201  Based on notes DX: history of Mild FVIII deficiency (historical baseline levels 11-22%, occasional levels approaching or exceeding 50%).   Treatment: On-demand treatment (rare use).   Also, with Development of an acquired inhibitor on or about 09/10/2010 With maximum titer of 88 BU on 10/08/10 and resolution in Dec of 2012. NovoSeven 90 micrograms/kg q 2 hours prn bleed. Immunosuppression with Rituximab for 2 doses last given 10/29/10.   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  Recommendation -ptt 42 today -would not recommend use of thrombolytics due to significantly increased risk of catastrophic systemic bleeding -- especially difficult to justify in light of resolving neurological symptoms -there aren't any standard guidelines on use of antiplatelet therapy for CVA in the setting of hemophilia A +/- inhibitors. There shall be some increased risk of bleeding and outside records have ASA listing as "allergy" for bleeding tendency. - in this patients context with mild F8 def with last know fviii levels of nearly 50% and baseline >5% and eradicated acquired inhibitor and limited h/o of recent spontaneous  bleeding with mild phenotype -- risk vs benefit evaluation suggest it might be reasonable to consider short term acute use of single antiplatelet therapy with baby ASA 81mg  po daily in the context of acute significant CVA that has potential to lead to debilitating neurodeficits. -discussed with Dr that control of his severe HTN shall be key in reducing his bleeding risk from Hemophilia as well as antiplatelet therapy. -patient would probably not be a good candidate for long term anti-platelet therapy(especially since he is not on prophylactic factor replacement) and will need to f/u with his hematologist at [facility] soon after hospital discharge to have this discussion. -will get rpt FVIII levels, PTT mixing study to evaluate for inhibitor and FVIII inhibitor assay.-- to determine current factor levels and presence of inhibitor. This would guide factor replacement if there were to be significant bleeding. Would use Advate if no inhibitor .  [facility] MD MS

## 2020-08-02 NOTE — Consult Note (Addendum)
Neurology Consultation Reason for Consult: Code stroke  Requesting Physician: Virgina Norfolk  CC: Right-sided sensory symptoms  History is obtained from: Patient and chart review  HPI: Earl Williams is a 50 y.o. male with a past medical history significant for hypertension, hyperlipidemia, obesity (BMI 35), obstructive sleep apnea on CPAP, mild acquired factor VIII deficiency, kidney stone, presenting with acute onset neurological symptoms.  He reports he was in his normal state of health with no other recent symptoms at 11:30 PM when he suddenly began to have slight difficulty walking as well as sensory loss on the right side that progressed over a few seconds.  At the time of his initial arrival to the ED at 1:15 AM, the symptoms have been persistent.  CT revealed no acute intracranial process.     LKW: 11:30 PM tPA given?: No, symptoms resolved and hemophilia A history with elevated PTT is a contraindication Premorbid modified rankin scale:      0 - No symptoms.  ROS: All other review of systems was negative except as noted in the HPI.   Past Medical History:  Diagnosis Date  . Dyspnea   . Graves' disease   . Hemophilia (HCC)   . HLD (hyperlipidemia)   . HTN (hypertension)   . Kidney stone   . OSA on CPAP    Past Surgical History:  Procedure Laterality Date  . TONSILLECTOMY      Current Facility-Administered Medications:  .  clevidipine (CLEVIPREX) infusion 0.5 mg/mL, 0-21 mg/hr, Intravenous, Continuous, Curatolo, Adam, DO, Last Rate: 2 mL/hr at 08/02/20 0324, 1 mg/hr at 08/02/20 0324  Current Outpatient Medications:  .  amLODipine (NORVASC) 5 MG tablet, Take 1 tablet (5 mg total) by mouth daily. (Patient not taking: Reported on 06/09/2018), Disp: 30 tablet, Rfl: 0 .  CALCIUM PO, Take 2 tablets by mouth daily., Disp: , Rfl:  .  carvedilol (COREG) 12.5 MG tablet, Take 1 tablet (12.5 mg total) by mouth 2 (two) times daily with a meal., Disp: 60 tablet, Rfl: 0 .   CYANOCOBALAMIN PO, Take 2 tablets by mouth daily., Disp: , Rfl:  .  furosemide (LASIX) 40 MG tablet, Take 1 tablet (40 mg total) by mouth daily. (Patient not taking: Reported on 06/09/2018), Disp: 30 tablet, Rfl: 0 .  hydrALAZINE (APRESOLINE) 50 MG tablet, Take 1 tablet (50 mg total) by mouth 2 (two) times daily., Disp: 60 tablet, Rfl: 0 .  HYDROcodone-acetaminophen (NORCO/VICODIN) 5-325 MG tablet, Take 1 tablet by mouth every 6 (six) hours as needed. (Patient not taking: Reported on 06/09/2018), Disp: 10 tablet, Rfl: 0 .  MAGNESIUM PO, Take 2 tablets by mouth daily., Disp: , Rfl:  .  ondansetron (ZOFRAN) 4 MG tablet, Take 1 tablet (4 mg total) by mouth every 8 (eight) hours as needed for nausea or vomiting., Disp: 15 tablet, Rfl: 0 .  tamsulosin (FLOMAX) 0.4 MG CAPS capsule, Take 1 capsule (0.4 mg total) by mouth daily. (Patient not taking: Reported on 06/09/2018), Disp: 20 capsule, Rfl: 0   Family History  Problem Relation Age of Onset  . Hypertension Other     Social History:  reports that he has never smoked. He has never used smokeless tobacco. He reports that he does not drink alcohol and does not use drugs.   Exam: Current vital signs: BP (!) 206/114 (BP Location: Left Arm)   Pulse 68   Temp 98.4 F (36.9 C) (Oral)   Resp 14   SpO2 97%  Vital signs in last 24  hours: Temp:  [98.4 F (36.9 C)] 98.4 F (36.9 C) (05/20 0105) Pulse Rate:  [68] 68 (05/20 0105) Resp:  [14] 14 (05/20 0105) BP: (206)/(114) 206/114 (05/20 0105) SpO2:  [97 %] 97 % (05/20 0105)   Physical Exam  Constitutional: Appears well-developed and well-nourished.  Psych: Affect appropriate to situation, calm and cooperative Eyes: No scleral injection HENT: No oropharyngeal obstruction.  MSK: no joint deformities.  Cardiovascular: Normal rate and regular rhythm.  Respiratory: Effort normal, non-labored breathing GI: Soft.  No distension. There is no tenderness.  Skin: Warm dry and intact visible  skin  Neuro: Mental Status: Patient is awake, alert, oriented to person, place, month, year, and situation. Patient is able to give a clear and coherent history. No signs of aphasia or neglect Cranial Nerves: II: Visual Fields are full. Pupils are equal, round, and reactive to light.   III,IV, VI: EOMI without ptosis or diploplia.  V: Facial sensation is symmetric to temperature VII: Facial movement is symmetric.  VIII: hearing is intact to voice X: Uvula elevates symmetrically XI: Shoulder shrug is symmetric. XII: tongue is midline without atrophy or fasciculations.  Motor: Tone is normal. Bulk is normal.  There is no pronator drift and bilateral lower extremities were Sensory: Initially sensation was reduced in the right arm and leg, but this resolved after MRI scan Deep Tendon Reflexes: 2+ and symmetric in the biceps and patellae.  Cerebellar: FNF and HKS are intact bilaterally Gait: He was able to rise on heels and toes as well as maintain a tandem stance without wavering  NIHSS total 1 initially, resolved to 0 Score breakdown: Initial point was for right-sided arm and leg numbness   I have reviewed labs in epic and the results pertinent to this consultation are: Creatinine 1.6 (baseline), CBC with mildly reduced platelets at 141 (baseline 180-200s), hemoglobin 15.4 PT 14, INR 1.1, PTT elevated at 42  I have reviewed the images obtained: Head CT without acute intracranial process MRI brain with subtle diffusion restriction in the left thalamic capsular junction, key image below   Impression: This is a patient with multiple small vessel risk factors (hypertension, hyperlipidemia, OSA, obesity) presenting with right-sided sensory symptoms concerning for an acute ischemic lesion of the left thalamus/internal capsule.  MRI brain revealed no acute intracranial process per radiology though on my read there is some subtle diffusion restriction in the left thalamic border/internal  capsule without FLAIR correlate (DWI image above), which is in the correct location to explain his symptoms.  Fortunately after MRI brain his symptoms were rapidly resolving and he reported he felt at his baseline.  There is a risk that this is a stuttering lacunar stroke and his symptoms may worsen again. However in discussion with pharmacy and hematology hemophilia A especially in the setting of elevated PTT would put the patient at an unacceptably high risk of hemorrhage.    In discussion with hematology (Dr. Clois Dupes) given the acute nature of symptoms 81 mg of aspirin in the short-term was felt to be reasonable intervention, though he recommended against dual antiplatelet therapy  Recommendations: # Left thalamic capsular warning syndrome versus stuttering lacunar stroke - Stroke labs HgbA1c, fasting lipid panel - MRI brain completed - MRA of the brain without contrast and MRA neck w/wo - Frequent neuro checks - Echocardiogram  - Prophylactic therapy-Antiplatelet med: Aspirin 81 mg daily - Risk factor modification, diet, exercise, weight loss - Telemetry monitoring;  - Blood pressure goal   - Management of  hypertensive emergency/urgency per standard protocols, defer to ED/primary team - Appreciate hematology recommendations - Appreciate management of comorbidities per primary team - Stroke team to follow  Brooke Dare MD-PhD [facility] [phone] Available 7 PM to 7 AM, outside of these hours please call Neurologist on call as listed on Amion.  CRITICAL CARE Performed by: Gordy Councilman  Total critical care time: 95 minutes  Critical care time was exclusive of separately billable procedures and treating other patients.  Critical care was necessary to treat or prevent imminent or life-threatening deterioration, patient was being evaluated for acute onset neurological symptoms with high risk of permanent disability requiring complex and rapid medical  decision making as documented above.  Critical care was time spent personally by me on the following activities: development of treatment plan with patient and/or surrogate as well as nursing, discussions with consultants, examination of patient, obtaining history from patient or surrogate, ordering and performing treatments and interventions, ordering and review of laboratory studies, ordering and review of radiographic studies, pulse oximetry and re-evaluation of patient's condition.

## 2020-08-03 ENCOUNTER — Inpatient Hospital Stay (HOSPITAL_COMMUNITY): Payer: Self-pay

## 2020-08-03 LAB — BASIC METABOLIC PANEL
Anion gap: 7 (ref 5–15)
BUN: 28 mg/dL — ABNORMAL HIGH (ref 6–20)
CO2: 23 mmol/L (ref 22–32)
Calcium: 8.6 mg/dL — ABNORMAL LOW (ref 8.9–10.3)
Chloride: 108 mmol/L (ref 98–111)
Creatinine, Ser: 1.77 mg/dL — ABNORMAL HIGH (ref 0.61–1.24)
GFR, Estimated: 46 mL/min — ABNORMAL LOW (ref >60)
Glucose, Bld: 125 mg/dL — ABNORMAL HIGH (ref 70–99)
Potassium: 3.7 mmol/L (ref 3.5–5.1)
Sodium: 138 mmol/L (ref 135–145)

## 2020-08-03 LAB — PTT FACTOR INHIBITOR (MIXING STUDY): aPTT: 30.7 s — ABNORMAL HIGH (ref 22.9–30.2)

## 2020-08-03 LAB — MAGNESIUM: Magnesium: 2 mg/dL (ref 1.7–2.4)

## 2020-08-03 LAB — CBC
HCT: 45.1 % (ref 39.0–52.0)
Hemoglobin: 15.4 g/dL (ref 13.0–17.0)
MCH: 31.7 pg (ref 26.0–34.0)
MCHC: 34.1 g/dL (ref 30.0–36.0)
MCV: 92.8 fL (ref 80.0–100.0)
Platelets: 146 10*3/uL — ABNORMAL LOW (ref 150–400)
RBC: 4.86 MIL/uL (ref 4.22–5.81)
RDW: 13.4 % (ref 11.5–15.5)
WBC: 9.6 10*3/uL (ref 4.0–10.5)
nRBC: 0 % (ref 0.0–0.2)

## 2020-08-03 LAB — FACTOR 8 ASSAY: Coagulation Factor VIII: 23 % — ABNORMAL LOW (ref 56–140)

## 2020-08-03 LAB — TROPONIN I (HIGH SENSITIVITY): Troponin I (High Sensitivity): 34 ng/L — ABNORMAL HIGH (ref <18)

## 2020-08-03 LAB — GLUCOSE, CAPILLARY: Glucose-Capillary: 185 mg/dL — ABNORMAL HIGH (ref 70–99)

## 2020-08-03 MED ORDER — CARVEDILOL 25 MG PO TABS: 25.0000 mg | ORAL_TABLET | Freq: Two times a day (BID) | ORAL | 0 refills | Status: DC

## 2020-08-03 MED ORDER — AMLODIPINE BESYLATE 10 MG PO TABS: 10.0000 mg | ORAL_TABLET | Freq: Every day | ORAL | 0 refills | Status: AC

## 2020-08-03 MED ORDER — HYDRALAZINE HCL 50 MG PO TABS: 50.0000 mg | ORAL_TABLET | Freq: Three times a day (TID) | ORAL | 0 refills | Status: AC

## 2020-08-03 MED ORDER — HYDROCHLOROTHIAZIDE 12.5 MG PO CAPS: 12.5000 mg | ORAL_CAPSULE | Freq: Every day | ORAL | 0 refills | Status: DC

## 2020-08-03 NOTE — Progress Notes (Addendum)
STROKE TEAM PROGRESS NOTE   INTERVAL HISTORY Patient is sitting up in bed.  He has no complaints.  He wants to go home.  Repeat MRI scan of the brain done today shows a tiny punctate left thalamic lacunar infarct which was not seen on the initial admission MRI.  Patient still has right-sided subjective paresthesias but no objective deficits.  He is not willing to try taking aspirin 81 mg without opinion from his hematologist at [facility].  Vitals:   08/02/20 2322 08/03/20 0336 08/03/20 0839 08/03/20 1127  BP: 130/62 (!) 162/86 (!) 155/83 130/74  Pulse: 77 69 65 (!) 56  Resp: 18 18 18 16   Temp: 99 F (37.2 C) 98 F (36.7 C) 98.2 F (36.8 C) 97.9 F (36.6 C)  TempSrc: Oral Oral Oral Oral  SpO2: 95% 94% 97% 97%  Weight:      Height:       CBC:  Recent Labs  Lab 08/02/20 0121 08/02/20 0128 08/02/20 0440 08/03/20 0142  WBC 8.7  --  9.6 9.6  NEUTROABS 3.9  --   --   --   HGB 15.4   < > 17.9* 15.4  HCT 45.4   < > 53.1* 45.1  MCV 92.3  --  92.5 92.8  PLT 141*  --  146* 146*   < > = values in this interval not displayed.   Basic Metabolic Panel:  Recent Labs  Lab 08/02/20 0440 08/03/20 0142  NA 137 138  K 4.0 3.7  CL 105 108  CO2 22 23  GLUCOSE 129* 125*  BUN 22* 28*  CREATININE 1.49* 1.77*  CALCIUM 9.3 8.6*  MG  --  2.0   Lipid Panel:  Recent Labs  Lab 08/02/20 0323  CHOL 137  TRIG 110  HDL 50  CHOLHDL 2.7  VLDL 22  LDLCALC 65   HgbA1c:  Recent Labs  Lab 08/02/20 0440  HGBA1C 6.3*   Urine Drug Screen:  Recent Labs  Lab 08/02/20 0155  LABOPIA NONE DETECTED  COCAINSCRNUR NONE DETECTED  LABBENZ NONE DETECTED  AMPHETMU NONE DETECTED  THCU NONE DETECTED  LABBARB NONE DETECTED    Alcohol Level  Recent Labs  Lab 08/02/20 0120  ETH <10    IMAGING past 24 hours MR BRAIN WO CONTRAST  Result Date: 08/03/2020 CLINICAL DATA:  50 year old male code stroke presentation yesterday. Brain MRI yesterday negative for acute ischemia. Repeat DWI only exam  requested. EXAM: MRI HEAD WITHOUT CONTRAST TECHNIQUE: Axial and coronal diffusion-weighted imaging of the brain and surrounding structures were obtained without intravenous contrast. COMPARISON:  Brain MRI, MRA yesterday. FINDINGS: Subtle linear focus of restricted diffusion in the left thalamus is now apparent on both series 2, image 27 and series 3, image 17. This appears adjacent to a small chronic thalamic lacunar infarct demonstrated yesterday (series 250, image 27 now). No other convincing restricted diffusion. No intracranial mass effect or ventriculomegaly. 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 Electronically Signed   By: 43 M.D.   On: 08/03/2020 08:38   PHYSICAL EXAM Constitutional: Appears well-developed and well-nourished.  Psych: Affect appropriate to situation, calm and cooperative Eyes: No scleral injection HENT: No oropharyngeal obstruction.  MSK: no joint deformities.  Cardiovascular: Normal rate and regular rhythm.  Respiratory: Effort normal, non-labored breathing GI: Soft.  No distension. There is no tenderness.  Skin: Warm dry and intact visible skin  Neuro: Mental Status: Patient is  awake and alert oriented to person, place, month, year, and situation.  Patient is able to give a clear and coherent history. Speech is clear and fluent. Follows multistep commands briskly.  Cranial Nerves: II: Visual Fields are full. Pupils are equal, round, and reactive to light.   III,IV, VI: EOMI without ptosis or diploplia.  V: Facial sensation is symmetric to touch V1-V3 VII: Facial movement is symmetric.  VIII: hearing is intact to voice X: Uvula elevates symmetrically XI: Shoulder shrug is symmetric. XII: tongue is midline without atrophy or fasciculations.  Motor: Tone is normal. Bulk is normal.  There is no pronator drift and bilateral lower extremities were Sensory: Reduced sensation  Right UE and LE.  Cerebellar: FNF and HKS are intact bilaterally Gait: Deferred   ASSESSMENT/PLAN [name] is a 50 y.o. male with history of  multiple small vessel risk factors uncontrolled hypertension, hyperlipidemia, OSA, obesity and also with  hemophilia A and uncontrolled Graves disease presenting with right-sided sensory symptoms concerning for an acute ischemic lesion of the left thalamus/internal capsule.  MRI brain revealed no acute intracranial process per radiology however, Dr. Rollene Fare read as subtle diffusion restriction in the left thalamic border/internal capsule without FLAIR correlate. Fortunately after MRI brain his symptoms were rapidly resolving and he reported he felt at his baseline. His history of  hemophilia A especially in the setting of elevated PTT  put the patient at an unacceptably high risk of hemorrhage therefore tPA was not given.  Subjective right hemiparesthesia -due to small left thalamic infarct from small vessel disease   Complicated by history hemophilia A limiting treatment options.    Code Stroke: CT head No acute abnormality.   MRI : No acute intracranial abnormality. ? Subtle diffusion restriction in left thalamic border  MRA head and neck normal   MRI with contrast - No abnormal intracranial enhancement.  Repeat MRI without contrast confirmed small left thalamic lacunar infarct   2D Echo: EF 70-75%  LDL 65  HgbA1c 6.3  VTE prophylaxis - None  No AC/AP prior to admission, currently recommend ASA 81mg  alone but patient unwilling to start (risk vs. Benefit per hematology as below). If repeat MRI negative for stroke, will d/c ASA.   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 advised no thrombolytics and Risk vs. Benefit of antiplatelet therapy: reasonable to consider short term acute use of single  antiplatelet therapy with baby ASA 81mg  po daily but patient would probably not be a good candidate for long term anti-platelet therapy(especially since he is not on prophylactic factor replacement) and will need to f/u with his hematologist   Followed by Dr Candise Che at [facility]: close follow up needed  Recommend ASA 81mg  alone.  But patient unwilling Hyperthyroidism, uncontrolled  TSH < 0.010, FT4 2.91  Per pt, he was treated in 2016 and after 6 months of treatment, his thyroid test all normal so he was taken off the medication  Likely HTN on presentation associated with hyperthyroidism   Management per CCM  Hypertensive emergency, secondary  On amlodipine, norvasc, lasix, apresoline  Now off cleviprex infusion  Dr. [facility] advises control of his severe HTN shall be key in reducing his bleeding risk from Hemophilia as well as antiplatelet therapy.  Uncontrolled Grave's disease contributing  . Long-term BP goal normotensive  Other Stroke Risk Factors  Obesity, Body mass index is 32.95  kg/m., BMI >/= 30 associated with increased stroke risk, recommend weight loss, diet and exercise as appropriate   Obstructive sleep apnea  Other Active Problems    Hospital day # 1      On exam, patient neurologically intact including bilateral light touch sensation symmetrical.  However, he had a subjective numbness on the right arm and leg, resolved face involvement.  Etiology for subjective numbness likely small left thalamic infarct found on repeat MRI.Marland Kitchen  Patient however is unwilling to start aspirin without first seeing his hematologist at [facility] continue further management per primary team for hyperthyroidism than and secondary hypertension.   Greater than 50% time during this 25-minute visit was spent on counseling and coordination of care and discussion about stroke and hemophilia.  Discussed with Dr. Meade Maw, MD Stroke Neurology 08/03/2020 2:08 PM     To contact Stroke  Continuity provider, please refer to [email or web address]. After hours, contact General Neurology

## 2020-08-03 NOTE — Progress Notes (Signed)
Patient ready for discharge; discharge instructions given and reviewed; RX given and others are sent electronically; patient discharged out via wheelchair.

## 2020-08-03 NOTE — Care Management (Signed)
Spoke w patient over the phone. Discussed medications and costs. He states that he works and pays for them with cash. He states that he can afford his medication. Starting HCTZ, discussed good RX price at [facility], he states he will be able to afford. Also discussed pharmacy program at [facility] for him to look into as his prescriptions could be less expensive.  Patient to call and schedule w PCP Monday.

## 2020-08-03 NOTE — Discharge Summary (Addendum)
Physician Discharge Summary  Earl Williams UJW:[id number] DOB: 10-13-1970 DOA: 08/02/2020  PCP: Fleet Contras, MD  Admit date: 08/02/2020 Discharge date: 08/03/2020  Admitted From: Home Disposition: Home  Recommendations for Outpatient Follow-up:  1. Follow up with PCP in 1-2 weeks 2. Please obtain BMP/CBC in one week 3. Please follow up with neurology as discussed 4. Follow-up with hematologist at [facility] for further discussion about initiation of aspirin versus Plavix given high risk for bleeding  Home Health: None Equipment/Devices: None indicated  Discharge Condition: Stable CODE STATUS: Full Diet recommendation: Low-salt low-fat diet  Brief/Interim Summary: Earl Williams a 49 y.o.malewith a past medical history significant for hypertension, hyperlipidemia, obesity (BMI 35), obstructive sleep apnea on CPAP, mild acquired factor VIII deficiency, kidney stone, presenting with acute onset neurological symptoms of R sided numbness with SBP elevated to 250. He reports he was in his normal state of health with no other recent symptoms at 11:30 PM when he suddenly began to have slight difficulty walking as well as sensory loss on the right side that progressed over a few seconds. At the time of his initial arrival to the ED at 1:15 AM, the symptoms have been persistent. CT revealed no acute intracranial process. Initially required Cleviprex infusion to reduce profound hypertension.  Patient admitted as above with acute hypertensive emergency with systolic blood pressure as high as 250 with difficulty walking and sensory loss on the right side of his body.  Patient's blood pressure was drastically improved in the ICU, home medications were titrated and patient had increased p.o. medications and will be discharged on numerous new medications as outlined below.  His blood pressure now is otherwise within normal limits, initial MRI and MRA unremarkable for any acute  findings.  Repeat MRI does show small acute punctate infarct, unclear if this is related to his paresthesias or not.  Unfortunately due to his history of hemophilia he is not a candidate for aspirin or Plavix, main control for risk factors for this patient would be diet, low-cholesterol diet and blood pressure control. Today we discussed his right upper extremity paresthesias that appear to be localized to his hand and forearm only. Appear to be in an ulnar locality rather than radial and could represent peripheral nerve impingement. We did recommend follow-up with neurology for outpatient nerve conduction study or further work-up and imaging per their expertise if indicated.  Patient otherwise stable and agreeable for discharge home given resolution of hypertensive emergency and symptoms.  Otherwise close follow-up with PCP in the next 1 to 2 weeks for further evaluation of blood pressure to ensure ongoing medication compliance, we did discuss the risks of medication noncompliance that include elevated risk of stroke, permanent disability and even death.  Discharge Diagnoses:  Active Problems:   Hypertensive emergency   TIA (transient ischemic attack) Acute CVA  Discharge Instructions  Discharge Instructions    Call MD for:  difficulty breathing, headache or visual disturbances   Complete by: As directed    Call MD for:  extreme fatigue   Complete by: As directed    Call MD for:  persistant dizziness or light-headedness   Complete by: As directed    Call MD for:  severe uncontrolled pain   Complete by: As directed    Diet - low sodium heart healthy   Complete by: As directed    Increase activity slowly   Complete by: As directed      Allergies as of 08/03/2020  Reactions   Aspirin    Pt has hemophilia    Hydromorphone Nausea And Vomiting   Other reaction(s): GI Upset (intolerance) Stomach upset. But can take with zofran   Dilaudid [hydromorphone Hcl]    Stomach upset.       Medication List    STOP taking these medications   tamsulosin 0.4 MG Caps capsule Commonly known as: FLOMAX     TAKE these medications   acetaminophen 500 MG tablet Commonly known as: TYLENOL Take 1,000 mg by mouth every 6 (six) hours as needed for mild pain.   amLODipine 10 MG tablet Commonly known as: NORVASC Take 1 tablet (10 mg total) by mouth daily. Start taking on: Aug 04, 2020 What changed:   medication strength  how much to take   carvedilol 25 MG tablet Commonly known as: COREG Take 1 tablet (25 mg total) by mouth 2 (two) times daily with a meal. What changed:   medication strength  how much to take   furosemide 40 MG tablet Commonly known as: LASIX Take 1 tablet (40 mg total) by mouth daily. What changed:   when to take this  reasons to take this   hydrALAZINE 50 MG tablet Commonly known as: APRESOLINE Take 1 tablet (50 mg total) by mouth 3 (three) times daily. What changed: when to take this   hydrochlorothiazide 12.5 MG capsule Commonly known as: MICROZIDE Take 1 capsule (12.5 mg total) by mouth daily. Start taking on: Aug 04, 2020   HYDROcodone-acetaminophen 5-325 MG tablet Commonly known as: NORCO/VICODIN Take 1 tablet by mouth every 6 (six) hours as needed.   ondansetron 4 MG tablet Commonly known as: ZOFRAN Take 1 tablet (4 mg total) by mouth every 8 (eight) hours as needed for nausea or vomiting.       Allergies  Allergen Reactions  . Aspirin     Pt has hemophilia   . Hydromorphone Nausea And Vomiting    Other reaction(s): GI Upset (intolerance) Stomach upset. But can take with zofran  . Dilaudid [Hydromorphone Hcl]     Stomach upset.    Consultations:  ICU, neurology   Procedures/Studies: MR ANGIO HEAD WO CONTRAST  Result Date: 08/02/2020 CLINICAL DATA:  TIA.  Right-sided numbness and difficulty walking. EXAM: MRI HEAD WITH CONTRAST MRA HEAD WITHOUT CONTRAST MRA NECK WITHOUT AND WITH CONTRAST TECHNIQUE: Multiplanar,  multiecho pulse sequences of the brain and surrounding structures were obtained with intravenous contrast. Angiographic images of the Circle of Willis were obtained using MRA technique without intravenous contrast. Angiographic images of the neck were obtained using MRA technique without and with intravenous contrast. Carotid stenosis measurements (when applicable) are obtained utilizing NASCET criteria, using the distal internal carotid diameter as the denominator. CONTRAST:  9mL GADAVIST GADOBUTROL 1 MMOL/ML IV SOLN COMPARISON:  Noncontrast head MRI 08/02/2020 FINDINGS: MRI HEAD FINDINGS No abnormal brain parenchymal or meningeal enhancement is identified. The major dural venous sinuses are enhancing. MRA HEAD FINDINGS The intracranial vertebral arteries are widely patent to the basilar. Patent left PICA, right AICA, and bilateral SCA origins are visualized. The basilar artery is widely patent. Posterior communicating arteries are diminutive or absent. Both PCAs are patent without evidence of a significant proximal stenosis. The internal carotid arteries are widely patent from skull base to carotid termini. ACAs and MCAs are patent without evidence of a proximal branch occlusion or significant proximal stenosis. No aneurysm is identified. MRA NECK FINDINGS There is motion artifact through the upper chest and lower neck. The brachiocephalic and  subclavian arteries are patent without evidence of a flow limiting stenosis. The common carotid arteries and carotid bifurcations are widely patent. The mid and distal portions of the cervical internal carotid arteries were not included on the contrast-enhanced MRA, however they are patent without evidence of a significant stenosis or dissection on the noncontrast time-of-flight MRA. The vertebral arteries are patent and codominant with antegrade flow bilaterally. No significant vertebral artery stenosis or dissection is identified. IMPRESSION: 1. No abnormal intracranial  enhancement. 2. Negative head MRA. 3. Negative neck MRA. Electronically Signed   By: Sebastian Ache M.D.   On: 08/02/2020 10:00   MR ANGIO NECK W WO CONTRAST  Result Date: 08/02/2020 CLINICAL DATA:  TIA.  Right-sided numbness and difficulty walking. EXAM: MRI HEAD WITH CONTRAST MRA HEAD WITHOUT CONTRAST MRA NECK WITHOUT AND WITH CONTRAST TECHNIQUE: Multiplanar, multiecho pulse sequences of the brain and surrounding structures were obtained with intravenous contrast. Angiographic images of the Circle of Willis were obtained using MRA technique without intravenous contrast. Angiographic images of the neck were obtained using MRA technique without and with intravenous contrast. Carotid stenosis measurements (when applicable) are obtained utilizing NASCET criteria, using the distal internal carotid diameter as the denominator. CONTRAST:  9mL GADAVIST GADOBUTROL 1 MMOL/ML IV SOLN COMPARISON:  Noncontrast head MRI 08/02/2020 FINDINGS: MRI HEAD FINDINGS No abnormal brain parenchymal or meningeal enhancement is identified. The major dural venous sinuses are enhancing. MRA HEAD FINDINGS The intracranial vertebral arteries are widely patent to the basilar. Patent left PICA, right AICA, and bilateral SCA origins are visualized. The basilar artery is widely patent. Posterior communicating arteries are diminutive or absent. Both PCAs are patent without evidence of a significant proximal stenosis. The internal carotid arteries are widely patent from skull base to carotid termini. ACAs and MCAs are patent without evidence of a proximal branch occlusion or significant proximal stenosis. No aneurysm is identified. MRA NECK FINDINGS There is motion artifact through the upper chest and lower neck. The brachiocephalic and subclavian arteries are patent without evidence of a flow limiting stenosis. The common carotid arteries and carotid bifurcations are widely patent. The mid and distal portions of the cervical internal carotid  arteries were not included on the contrast-enhanced MRA, however they are patent without evidence of a significant stenosis or dissection on the noncontrast time-of-flight MRA. The vertebral arteries are patent and codominant with antegrade flow bilaterally. No significant vertebral artery stenosis or dissection is identified. IMPRESSION: 1. No abnormal intracranial enhancement. 2. Negative head MRA. 3. Negative neck MRA. Electronically Signed   By: Sebastian Ache M.D.   On: 08/02/2020 10:00   MR BRAIN WO CONTRAST  Result Date: 08/03/2020 CLINICAL DATA:  50 year old male code stroke presentation yesterday. Brain MRI yesterday negative for acute ischemia. Repeat DWI only exam requested. EXAM: MRI HEAD WITHOUT CONTRAST TECHNIQUE: Axial and coronal diffusion-weighted imaging of the brain and surrounding structures were obtained without intravenous contrast. COMPARISON:  Brain MRI, MRA yesterday. FINDINGS: Subtle linear focus of restricted diffusion in the left thalamus is now apparent on both series 2, image 27 and series 3, image 17. This appears adjacent to a small chronic thalamic lacunar infarct demonstrated yesterday (series 250, image 27 now). No other convincing restricted diffusion. No intracranial mass effect or ventriculomegaly. 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 Electronically Signed   By: Odessa Fleming M.D.   On: 08/03/2020 08:38   MR BRAIN WO CONTRAST  Result Date:  08/02/2020 CLINICAL DATA:  Code stroke follow-up EXAM: MRI HEAD WITHOUT CONTRAST TECHNIQUE: Multiplanar, multiecho pulse sequences of the brain and surrounding structures were obtained without intravenous contrast. COMPARISON:  None. FINDINGS: Brain: No acute infarct, mass effect or extra-axial collection. No acute or chronic hemorrhage. There is multifocal hyperintense T2-weighted signal within the white matter. Parenchymal volume and CSF spaces are normal.  The midline structures are normal. Vascular: Major flow voids are preserved. Skull and upper cervical spine: Normal calvarium and skull base. Visualized upper cervical spine and soft tissues are normal. Sinuses/Orbits:No paranasal sinus fluid levels or advanced mucosal thickening. No mastoid or middle ear effusion. Normal orbits. IMPRESSION: 1. No acute intracranial abnormality. 2. Multifocal hyperintense T2-weighted signal within the white matter, most commonly seen in the setting of chronic small vessel ischemia. Electronically Signed   By: Deatra Robinson M.D.   On: 08/02/2020 02:34   MR BRAIN W CONTRAST  Result Date: 08/02/2020 CLINICAL DATA:  TIA.  Right-sided numbness and difficulty walking. EXAM: MRI HEAD WITH CONTRAST MRA HEAD WITHOUT CONTRAST MRA NECK WITHOUT AND WITH CONTRAST TECHNIQUE: Multiplanar, multiecho pulse sequences of the brain and surrounding structures were obtained with intravenous contrast. Angiographic images of the Circle of Willis were obtained using MRA technique without intravenous contrast. Angiographic images of the neck were obtained using MRA technique without and with intravenous contrast. Carotid stenosis measurements (when applicable) are obtained utilizing NASCET criteria, using the distal internal carotid diameter as the denominator. CONTRAST:  9mL GADAVIST GADOBUTROL 1 MMOL/ML IV SOLN COMPARISON:  Noncontrast head MRI 08/02/2020 FINDINGS: MRI HEAD FINDINGS No abnormal brain parenchymal or meningeal enhancement is identified. The major dural venous sinuses are enhancing. MRA HEAD FINDINGS The intracranial vertebral arteries are widely patent to the basilar. Patent left PICA, right AICA, and bilateral SCA origins are visualized. The basilar artery is widely patent. Posterior communicating arteries are diminutive or absent. Both PCAs are patent without evidence of a significant proximal stenosis. The internal carotid arteries are widely patent from skull base to carotid termini.  ACAs and MCAs are patent without evidence of a proximal branch occlusion or significant proximal stenosis. No aneurysm is identified. MRA NECK FINDINGS There is motion artifact through the upper chest and lower neck. The brachiocephalic and subclavian arteries are patent without evidence of a flow limiting stenosis. The common carotid arteries and carotid bifurcations are widely patent. The mid and distal portions of the cervical internal carotid arteries were not included on the contrast-enhanced MRA, however they are patent without evidence of a significant stenosis or dissection on the noncontrast time-of-flight MRA. The vertebral arteries are patent and codominant with antegrade flow bilaterally. No significant vertebral artery stenosis or dissection is identified. IMPRESSION: 1. No abnormal intracranial enhancement. 2. Negative head MRA. 3. Negative neck MRA. Electronically Signed   By: Sebastian Ache M.D.   On: 08/02/2020 10:00   ECHOCARDIOGRAM COMPLETE  Result Date: 08/02/2020    ECHOCARDIOGRAM REPORT   Patient Name:   Earl Williams Date of Exam: 08/02/2020 Medical Rec #:  [id number]            Height:       66.0 in Accession #:    [id number]           Weight:       204.1 lb Date of Birth:  November 01, 1970             BSA:          2.017 m Patient Age:    62  years             BP:           193/99 mmHg Patient Gender: M                    HR:           71 bpm. Exam Location:  Inpatient Procedure: 2D Echo, Cardiac Doppler and Color Doppler Indications:    HCM  History:        Patient has prior history of Echocardiogram examinations, most                 recent 09/20/2016. Signs/Symptoms:Dyspnea; Risk                 Factors:Hypertension and Dyslipidemia.  Sonographer:    Neomia Dear RDCS Referring Phys: [id number] Lynnell Catalan  Sonographer Comments: Technically challenging study due to limited acoustic windows. Image acquisition challenging due to respiratory motion. IMPRESSIONS  1. Left ventricular ejection  fraction, by estimation, is 70 to 75%. The left ventricle has hyperdynamic function. The left ventricle has no regional wall motion abnormalities. There is severe concentric left ventricular hypertrophy. Left ventricular diastolic parameters are consistent with Grade II diastolic dysfunction (pseudonormalization). Elevated left atrial pressure.  2. Right ventricular systolic function is normal. The right ventricular size is normal.  3. Left atrial size was severely dilated.  4. Right atrial size was mildly dilated.  5. The mitral valve is normal in structure. Trivial mitral valve regurgitation.  6. The aortic valve is tricuspid. There is mild calcification of the aortic valve. There is mild thickening of the aortic valve. Aortic valve regurgitation is trivial. Mild aortic valve sclerosis is present, with no evidence of aortic valve stenosis.  7. The inferior vena cava is dilated in size with <50% respiratory variability, suggesting right atrial pressure of 15 mmHg. Comparison(s): No significant change from prior study. Conclusion(s)/Recommendation(s): There is severe concentric LVH with concern for possible HCM vs infiltrative process vs hypertensive heart disease. Recommend cardiac MRI in the future for further work-up if has not been previously pursued. FINDINGS  Left Ventricle: Left ventricular ejection fraction, by estimation, is 70 to 75%. The left ventricle has hyperdynamic function. The left ventricle has no regional wall motion abnormalities. The left ventricular internal cavity size was normal in size. There is severe concentric left ventricular hypertrophy. Left ventricular diastolic parameters are consistent with Grade II diastolic dysfunction (pseudonormalization). Elevated left atrial pressure. No significant LVOT obstruction or SAM. Right Ventricle: The right ventricular size is normal. No increase in right ventricular wall thickness. Right ventricular systolic function is normal. Left Atrium: Left  atrial size was severely dilated. Right Atrium: Right atrial size was mildly dilated. Pericardium: There is no evidence of pericardial effusion. Mitral Valve: The mitral valve is normal in structure. There is mild thickening of the mitral valve leaflet(s). Trivial mitral valve regurgitation. Tricuspid Valve: The tricuspid valve is normal in structure. Tricuspid valve regurgitation is trivial. Aortic Valve: The aortic valve is tricuspid. There is mild calcification of the aortic valve. There is mild thickening of the aortic valve. Aortic valve regurgitation is trivial. Mild aortic valve sclerosis is present, with no evidence of aortic valve stenosis. Aortic valve mean gradient measures 4.0 mmHg. Aortic valve peak gradient measures 6.8 mmHg. Aortic valve area, by VTI measures 2.04 cm. Pulmonic Valve: The pulmonic valve was normal in structure. Pulmonic valve regurgitation is trivial. Aorta: The aortic root is normal in size and structure. Venous:  The inferior vena cava is dilated in size with less than 50% respiratory variability, suggesting right atrial pressure of 15 mmHg. IAS/Shunts: There is right bowing of the interatrial septum, suggestive of elevated left atrial pressure. No atrial level shunt detected by color flow Doppler.  LEFT VENTRICLE PLAX 2D LVIDd:         3.60 cm     Diastology LVIDs:         1.90 cm     LV e' medial:    3.98 cm/s LV PW:         2.00 cm     LV E/e' medial:  17.6 LV IVS:        1.70 cm     LV e' lateral:   4.56 cm/s LVOT diam:     1.90 cm     LV E/e' lateral: 15.4 LV SV:         55 LV SV Index:   27 LVOT Area:     2.84 cm  LV Volumes (MOD) LV vol d, MOD A2C: 93.7 ml LV vol d, MOD A4C: 75.9 ml LV vol s, MOD A2C: 25.4 ml LV vol s, MOD A4C: 21.5 ml LV SV MOD A2C:     68.3 ml LV SV MOD A4C:     75.9 ml LV SV MOD BP:      63.8 ml RIGHT VENTRICLE RV Basal diam:  4.60 cm RV Mid diam:    2.90 cm RV S prime:     14.40 cm/s TAPSE (M-mode): 1.6 cm LEFT ATRIUM              Index       RIGHT ATRIUM            Index LA diam:        4.10 cm  2.03 cm/m  RA Area:     18.50 cm LA Vol (A2C):   110.0 ml 54.53 ml/m RA Volume:   47.70 ml  23.65 ml/m LA Vol (A4C):   86.6 ml  42.93 ml/m LA Biplane Vol: 102.0 ml 50.56 ml/m  AORTIC VALVE                   PULMONIC VALVE AV Area (Vmax):    2.33 cm    PV Vmax:       0.95 m/s AV Area (Vmean):   2.02 cm    PV Vmean:      71.600 cm/s AV Area (VTI):     2.04 cm    PV VTI:        0.227 m AV Vmax:           130.00 cm/s PV Peak grad:  3.6 mmHg AV Vmean:          99.400 cm/s PV Mean grad:  2.0 mmHg AV VTI:            0.270 m AV Peak Grad:      6.8 mmHg AV Mean Grad:      4.0 mmHg LVOT Vmax:         107.00 cm/s LVOT Vmean:        70.700 cm/s LVOT VTI:          0.194 m LVOT/AV VTI ratio: 0.72  AORTA Ao Root diam: 3.50 cm Ao Asc diam:  3.40 cm MITRAL VALVE MV Area (PHT): 4.49 cm    SHUNTS MV Decel Time: 169 msec    Systemic VTI:  0.19 m MR Peak grad: 61.5  mmHg    Systemic Diam: 1.90 cm MR Mean grad: 41.0 mmHg MR Vmax:      392.00 cm/s MR Vmean:     306.0 cm/s MV E velocity: 70.00 cm/s MV A velocity: 42.70 cm/s MV E/A ratio:  1.64 Laurance FlattenHeather Pemberton MD Electronically signed by Laurance FlattenHeather Pemberton MD Signature Date/Time: 08/02/2020/2:56:56 PM    Final    CT HEAD CODE STROKE WO CONTRAST  Result Date: 08/02/2020 CLINICAL DATA:  Code stroke.  Left-sided numbness EXAM: CT HEAD WITHOUT CONTRAST TECHNIQUE: Contiguous axial images were obtained from the base of the skull through the vertex without intravenous contrast. COMPARISON:  None. FINDINGS: Brain: There is no mass, hemorrhage or extra-axial collection. The size and configuration of the ventricles and extra-axial CSF spaces are normal. The brain parenchyma is normal, without evidence of acute or chronic infarction. Vascular: No abnormal hyperdensity of the major intracranial arteries or dural venous sinuses. No intracranial atherosclerosis. Skull: The visualized skull base, calvarium and extracranial soft tissues are normal.  Sinuses/Orbits: No fluid levels or advanced mucosal thickening of the visualized paranasal sinuses. No mastoid or middle ear effusion. The orbits are normal. ASPECTS [facility]([place] Stroke Program Early CT Score) - Ganglionic level infarction (caudate, lentiform nuclei, internal capsule, insula, M1-M3 cortex): 7 - Supraganglionic infarction (M4-M6 cortex): 3 Total score (0-10 with 10 being normal): 10 IMPRESSION: 1. Normal head CT. 2. ASPECTS is 10. These results were communicated to Dr. Brooke DareSrishti Bhagat at 1:33 am on 08/02/2020 by text page via the [facility] messaging system. Electronically Signed   By: Deatra RobinsonKevin  Herman M.D.   On: 08/02/2020 01:33     Subjective: No acute issues or events this morning, back to baseline, denies nausea vomiting diarrhea constipation headache fevers chills chest pain shortness of breath vision changes or deficits.   Discharge Exam: Vitals:   08/03/20 0839 08/03/20 1127  BP: (!) 155/83 130/74  Pulse: 65 (!) 56  Resp: 18 16  Temp: 98.2 F (36.8 C) 97.9 F (36.6 C)  SpO2: 97% 97%   Vitals:   08/02/20 2322 08/03/20 0336 08/03/20 0839 08/03/20 1127  BP: 130/62 (!) 162/86 (!) 155/83 130/74  Pulse: 77 69 65 (!) 56  Resp: 18 18 18 16   Temp: 99 F (37.2 C) 98 F (36.7 C) 98.2 F (36.8 C) 97.9 F (36.6 C)  TempSrc: Oral Oral Oral Oral  SpO2: 95% 94% 97% 97%  Weight:      Height:        General: Pt is alert, awake, not in acute distress Cardiovascular: RRR, S1/S2 +, no rubs, no gallops Respiratory: CTA bilaterally, no wheezing, no rhonchi Abdominal: Soft, NT, ND, bowel sounds + Neuro: Right hand hypothenar and ulnar forearm paresthesias ongoing without weakness or deficit extremities: no edema, no cyanosis    The results of significant diagnostics from this hospitalization (including imaging, microbiology, ancillary and laboratory) are listed below for reference.     Microbiology: Recent Results (from the past 240 hour(s))  Resp Panel by RT-PCR (Flu A&B, Covid)  Nasopharyngeal Swab     Status: None   Collection Time: 08/02/20  3:28 AM   Specimen: Nasopharyngeal Swab; Nasopharyngeal(NP) swabs in vial transport medium  Result Value Ref Range Status   SARS Coronavirus 2 by RT PCR NEGATIVE NEGATIVE Final    Comment: (NOTE) SARS-CoV-2 target nucleic acids are NOT DETECTED.  The SARS-CoV-2 RNA is generally detectable in upper respiratory specimens during the acute phase of infection. The lowest concentration of SARS-CoV-2 viral copies this assay can detect  is 138 copies/mL. A negative result does not preclude SARS-Cov-2 infection and should not be used as the sole basis for treatment or other patient management decisions. A negative result may occur with  improper specimen collection/handling, submission of specimen other than nasopharyngeal swab, presence of viral mutation(s) within the areas targeted by this assay, and inadequate number of viral copies(<138 copies/mL). A negative result must be combined with clinical observations, patient history, and epidemiological information. The expected result is Negative.  Fact Sheet for Patients:  BloggerCourse.com  Fact Sheet for Healthcare Providers:  SeriousBroker.it  This test is no t yet approved or cleared by the [place] FDA and  has been authorized for detection and/or diagnosis of SARS-CoV-2 by FDA under an Emergency Use Authorization (EUA). This EUA will remain  in effect (meaning this test can be used) for the duration of the COVID-19 declaration under Section 564(b)(1) of the Act, 21 U.S.C.section 360bbb-3(b)(1), unless the authorization is terminated  or revoked sooner.       Influenza A by PCR NEGATIVE NEGATIVE Final   Influenza B by PCR NEGATIVE NEGATIVE Final    Comment: (NOTE) The Xpert Xpress SARS-CoV-2/FLU/RSV plus assay is intended as an aid in the diagnosis of influenza from Nasopharyngeal swab specimens and should not be  used as a sole basis for treatment. Nasal washings and aspirates are unacceptable for Xpert Xpress SARS-CoV-2/FLU/RSV testing.  Fact Sheet for Patients: BloggerCourse.com  Fact Sheet for Healthcare Providers: SeriousBroker.it  This test is not yet approved or cleared by the [place] FDA and has been authorized for detection and/or diagnosis of SARS-CoV-2 by FDA under an Emergency Use Authorization (EUA). This EUA will remain in effect (meaning this test can be used) for the duration of the COVID-19 declaration under Section 564(b)(1) of the Act, 21 U.S.C. section 360bbb-3(b)(1), unless the authorization is terminated or revoked.  Performed at [facility], [address] [place], [place] [address]   MRSA PCR Screening     Status: Abnormal   Collection Time: 08/02/20  3:00 PM   Specimen: Nasal Mucosa; Nasopharyngeal  Result Value Ref Range Status   MRSA by PCR POSITIVE (A) NEGATIVE Final    Comment:        The GeneXpert MRSA Assay (FDA approved for NASAL specimens only), is one component of a comprehensive MRSA colonization surveillance program. It is not intended to diagnose MRSA infection nor to guide or monitor treatment for MRSA infections. RESULT CALLED TO, READ BACK BY AND VERIFIED WITH: Allen Derry RN [id number] 08/02/20 A BROWNING Performed at [facility], [address], [place], [place] [address]      Labs: BNP (last 3 results) No results for input(s): BNP in the last 8760 hours. Basic Metabolic Panel: Recent Labs  Lab 08/02/20 0121 08/02/20 0128 08/02/20 0440 08/03/20 0142  NA 136 139 137 138  K 3.9 3.9 4.0 3.7  CL 105 105 105 108  CO2 24  --  22 23  GLUCOSE 105* 98 129* 125*  BUN 24* 24* 22* 28*  CREATININE 1.61* 1.50* 1.49* 1.77*  CALCIUM 8.6*  --  9.3 8.6*  MG  --   --   --  2.0   Liver Function Tests: Recent Labs  Lab 08/02/20 0121  AST 23  ALT 27  ALKPHOS 111  BILITOT 1.3*   PROT 6.0*  ALBUMIN 3.4*   No results for input(s): LIPASE, AMYLASE in the last 168 hours. No results for input(s): AMMONIA in the last  168 hours. CBC: Recent Labs  Lab 08/02/20 0121 08/02/20 0128 08/02/20 0440 08/03/20 0142  WBC 8.7  --  9.6 9.6  NEUTROABS 3.9  --   --   --   HGB 15.4 15.0 17.9* 15.4  HCT 45.4 44.0 53.1* 45.1  MCV 92.3  --  92.5 92.8  PLT 141*  --  146* 146*   Cardiac Enzymes: No results for input(s): CKTOTAL, CKMB, CKMBINDEX, TROPONINI in the last 168 hours. BNP: Invalid input(s): POCBNP CBG: Recent Labs  Lab 08/02/20 0530 08/02/20 1942 08/03/20 1130  GLUCAP 140* 134* 185*   D-Dimer No results for input(s): DDIMER in the last 72 hours. Hgb A1c Recent Labs    08/02/20 0440  HGBA1C 6.3*   Lipid Profile Recent Labs    08/02/20 0323  CHOL 137  HDL 50  LDLCALC 65  TRIG 110  CHOLHDL 2.7   Thyroid function studies Recent Labs    08/02/20 0440  TSH <0.010*   Anemia work up Recent Labs    08/02/20 0440  VITAMINB12 832   Urinalysis    Component Value Date/Time   COLORURINE YELLOW 08/02/2020 0155   APPEARANCEUR CLEAR 08/02/2020 0155   LABSPEC 1.011 08/02/2020 0155   PHURINE 6.0 08/02/2020 0155   GLUCOSEU NEGATIVE 08/02/2020 0155   HGBUR NEGATIVE 08/02/2020 0155   BILIRUBINUR NEGATIVE 08/02/2020 0155   KETONESUR NEGATIVE 08/02/2020 0155   PROTEINUR 100 (A) 08/02/2020 0155   UROBILINOGEN 0.2 12/12/2012 1622   NITRITE NEGATIVE 08/02/2020 0155   LEUKOCYTESUR NEGATIVE 08/02/2020 0155   Sepsis Labs Invalid input(s): PROCALCITONIN,  WBC,  LACTICIDVEN Microbiology Recent Results (from the past 240 hour(s))  Resp Panel by RT-PCR (Flu A&B, Covid) Nasopharyngeal Swab     Status: None   Collection Time: 08/02/20  3:28 AM   Specimen: Nasopharyngeal Swab; Nasopharyngeal(NP) swabs in vial transport medium  Result Value Ref Range Status   SARS Coronavirus 2 by RT PCR NEGATIVE NEGATIVE Final    Comment: (NOTE) SARS-CoV-2 target nucleic  acids are NOT DETECTED.  The SARS-CoV-2 RNA is generally detectable in upper respiratory specimens during the acute phase of infection. The lowest concentration of SARS-CoV-2 viral copies this assay can detect is 138 copies/mL. A negative result does not preclude SARS-Cov-2 infection and should not be used as the sole basis for treatment or other patient management decisions. A negative result may occur with  improper specimen collection/handling, submission of specimen other than nasopharyngeal swab, presence of viral mutation(s) within the areas targeted by this assay, and inadequate number of viral copies(<138 copies/mL). A negative result must be combined with clinical observations, patient history, and epidemiological information. The expected result is Negative.  Fact Sheet for Patients:  BloggerCourse.com  Fact Sheet for Healthcare Providers:  SeriousBroker.it  This test is no t yet approved or cleared by the [place] FDA and  has been authorized for detection and/or diagnosis of SARS-CoV-2 by FDA under an Emergency Use Authorization (EUA). This EUA will remain  in effect (meaning this test can be used) for the duration of the COVID-19 declaration under Section 564(b)(1) of the Act, 21 U.S.C.section 360bbb-3(b)(1), unless the authorization is terminated  or revoked sooner.       Influenza A by PCR NEGATIVE NEGATIVE Final   Influenza B by PCR NEGATIVE NEGATIVE Final    Comment: (NOTE) The Xpert Xpress SARS-CoV-2/FLU/RSV plus assay is intended as an aid in the diagnosis of influenza from Nasopharyngeal swab specimens and should not be used as a sole basis for  treatment. Nasal washings and aspirates are unacceptable for Xpert Xpress SARS-CoV-2/FLU/RSV testing.  Fact Sheet for Patients: BloggerCourse.com  Fact Sheet for Healthcare Providers: SeriousBroker.it  This  test is not yet approved or cleared by the [place] FDA and has been authorized for detection and/or diagnosis of SARS-CoV-2 by FDA under an Emergency Use Authorization (EUA). This EUA will remain in effect (meaning this test can be used) for the duration of the COVID-19 declaration under Section 564(b)(1) of the Act, 21 U.S.C. section 360bbb-3(b)(1), unless the authorization is terminated or revoked.  Performed at [facility], [address] [place], [place] [address]   MRSA PCR Screening     Status: Abnormal   Collection Time: 08/02/20  3:00 PM   Specimen: Nasal Mucosa; Nasopharyngeal  Result Value Ref Range Status   MRSA by PCR POSITIVE (A) NEGATIVE Final    Comment:        The GeneXpert MRSA Assay (FDA approved for NASAL specimens only), is one component of a comprehensive MRSA colonization surveillance program. It is not intended to diagnose MRSA infection nor to guide or monitor treatment for MRSA infections. RESULT CALLED TO, READ BACK BY AND VERIFIED WITH: Allen Derry RN [id number] 08/02/20 A BROWNING Performed at [facility], [address] [place], [place] [address]      Time coordinating discharge: Over 30 minutes  SIGNED:   Azucena Fallen, DO [facility] 08/03/2020, 1:08 PM Pager   If 7PM-7AM, please contact night-coverage [email or web address]

## 2020-08-07 LAB — THYROID STIMULATING IMMUNOGLOBULIN: Thyroid Stimulating Immunoglob: 22.4 IU/L — ABNORMAL HIGH (ref 0.00–0.55)

## 2020-08-08 LAB — MISC LABCORP TEST (SEND OUT)
Labcorp test code: 500370
Source (LabCorp): 2

## 2021-10-01 ENCOUNTER — Encounter: Payer: Self-pay | Admitting: Neurology

## 2021-10-01 ENCOUNTER — Ambulatory Visit (INDEPENDENT_AMBULATORY_CARE_PROVIDER_SITE_OTHER): Payer: Self-pay | Admitting: Neurology

## 2021-10-01 VITALS — BP 124/75 | HR 67 | Ht 66.0 in | Wt 214.0 lb

## 2021-10-01 DIAGNOSIS — I6381 Other cerebral infarction due to occlusion or stenosis of small artery: Secondary | ICD-10-CM

## 2021-10-01 NOTE — Progress Notes (Signed)
[facility]  PATIENT: Earl Williams DOB: 1970/10/27  REQUESTING CLINICIAN: Fleet Contras, MD HISTORY FROM: Patient  REASON FOR VISIT: Stroke follow up/Need letter for DOT    HISTORICAL  CHIEF COMPLAINT:  Chief Complaint  Patient presents with   Hospitalization Follow-up    Rm 14. Alone. NP/internal hospital referral for TIA.    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  Patient reports back in May 2022 he presented to the ED for right arm and leg numbness, denies any weakness at that time.  He did have an MRI brain which showed a left thalamic stroke.  He reports even prior to discharge from the hospital his symptoms resolved.  He did not require physical therapy.  Due to his history of hemophilia he was not put on aspirin or Plavix.  He reports since then he has been back to his normal self, denies any new symptoms and denies any weakness.  He reports that he is a [employer] and he need clearance from neurology.  No other complaints.  OTHER MEDICAL CONDITIONS: Left thalamic stoke, Hypertension, Hyperlipidemia, Hemophilia    REVIEW OF SYSTEMS: Full 14 system review of systems performed and negative with exception of: As noted in the HPI   ALLERGIES: Allergies  Allergen Reactions   Aspirin     Pt has hemophilia    Hydromorphone Nausea And Vomiting    Other reaction(s): GI Upset (intolerance) Stomach upset. But can take with zofran   Dilaudid [Hydromorphone Hcl]     Stomach upset.    HOME MEDICATIONS: Outpatient Medications Prior to Visit  Medication Sig Dispense Refill   acetaminophen (TYLENOL) 500 MG tablet Take 1,000 mg by mouth every 6 (six) hours as needed for mild pain.     amLODipine (NORVASC) 10 MG tablet Take 1 tablet (10 mg  total) by mouth daily. 30 tablet 0   furosemide (LASIX) 40 MG tablet Take 1 tablet (40 mg total) by mouth daily. (Patient taking differently: Take 40 mg by mouth as needed for fluid or edema.) 30 tablet 0   hydrALAZINE (APRESOLINE) 50 MG tablet Take 1 tablet (50 mg total) by mouth 3 (three) times daily. 90 tablet 0   labetalol (NORMODYNE) 200 MG tablet Take 1 tablet by mouth 2 (two) times daily.     ondansetron (ZOFRAN) 4 MG tablet Take 1 tablet (4 mg total) by mouth every 8 (eight) hours as needed for nausea or vomiting. 15 tablet 0   carvedilol (COREG) 25 MG tablet Take 1 tablet (25 mg total) by mouth 2 (two) times daily with a meal. 60 tablet 0   hydrochlorothiazide (MICROZIDE) 12.5 MG capsule Take 1 capsule (12.5 mg total) by mouth daily. 30 capsule 0   HYDROcodone-acetaminophen (NORCO/VICODIN) 5-325 MG tablet Take 1 tablet by mouth every 6 (six) hours as needed. (Patient not taking: No sig reported) 10 tablet 0   No facility-administered medications prior to visit.    PAST MEDICAL HISTORY: Past Medical History:  Diagnosis Date   Dyspnea    Graves' disease    Hemophilia (HCC)    HLD (hyperlipidemia)    HTN (hypertension)    Kidney stone    OSA on CPAP     PAST SURGICAL HISTORY: Past Surgical History:  Procedure Laterality Date   TONSILLECTOMY  FAMILY HISTORY: Family History  Problem Relation Age of Onset   Hypertension Other     SOCIAL HISTORY: Social History   Socioeconomic History   Marital status: Divorced    Spouse name: Not on file   Number of children: Not on file   Years of education: Not on file   Highest education level: Not on file  Occupational History   Not on file  Tobacco Use   Smoking status: Never   Smokeless tobacco: Never  Vaping Use   Vaping Use: Never used  Substance and Sexual Activity   Alcohol use: No   Drug use: No   Sexual activity: Yes  Other Topics Concern   Not on file  Social History Narrative   Not on file   Social  Determinants of Health   Financial Resource Strain: Not on file  Food Insecurity: Not on file  Transportation Needs: Not on file  Physical Activity: Not on file  Stress: Not on file  Social Connections: Not on file  Intimate Partner Violence: Not on file    PHYSICAL EXAM  GENERAL EXAM/CONSTITUTIONAL: Vitals:  Vitals:   10/01/21 0828  BP: 124/75  Pulse: 67  Weight: 214 lb (97.1 kg)  Height: 5\' 6"  (1.676 m)   Body mass index is 34.54 kg/m. Wt Readings from Last 3 Encounters:  10/01/21 214 lb (97.1 kg)  08/02/20 204 lb 2.3 oz (92.6 kg)  07/28/18 210 lb (95.3 kg)   Patient is in no distress; well developed, nourished and groomed; neck is supple  EYES: Pupils round and reactive to light, Visual fields full to confrontation, Extraocular movements intacts,   MUSCULOSKELETAL: Gait, strength, tone, movements noted in Neurologic exam below  NEUROLOGIC: MENTAL STATUS:      No data to display         awake, alert, oriented to person, place and time recent and remote memory intact normal attention and concentration language fluent, comprehension intact, naming intact fund of knowledge appropriate  CRANIAL NERVE:  2nd, 3rd, 4th, 6th - pupils equal and reactive to light, visual fields full to confrontation, extraocular muscles intact, no nystagmus 5th - facial sensation symmetric 7th - facial strength symmetric 8th - hearing intact 9th - palate elevates symmetrically, uvula midline 11th - shoulder shrug symmetric 12th - tongue protrusion midline  MOTOR:  normal bulk and tone, full strength in the BUE, BLE  SENSORY:  normal and symmetric to light touch, pinprick, temperature, vibration  COORDINATION:  finger-nose-finger, fine finger movements normal  REFLEXES:  deep tendon reflexes present and symmetric  GAIT/STATION:  normal   DIAGNOSTIC DATA (LABS, IMAGING, TESTING) - I reviewed patient records, labs, notes, testing and imaging myself where  available.  Lab Results  Component Value Date   WBC 9.6 08/03/2020   HGB 15.4 08/03/2020   HCT 45.1 08/03/2020   MCV 92.8 08/03/2020   PLT 146 (L) 08/03/2020      Component Value Date/Time   NA 138 08/03/2020 0142   K 3.7 08/03/2020 0142   CL 108 08/03/2020 0142   CO2 23 08/03/2020 0142   GLUCOSE 125 (H) 08/03/2020 0142   BUN 28 (H) 08/03/2020 0142   CREATININE 1.77 (H) 08/03/2020 0142   CALCIUM 8.6 (L) 08/03/2020 0142   PROT 6.0 (L) 08/02/2020 0121   ALBUMIN 3.4 (L) 08/02/2020 0121   AST 23 08/02/2020 0121   ALT 27 08/02/2020 0121   ALKPHOS 111 08/02/2020 0121   BILITOT 1.3 (H) 08/02/2020 0121   GFRNONAA 46 (L)  08/03/2020 0142   GFRAA 46 (L) 09/21/2016 0703   Lab Results  Component Value Date   CHOL 137 08/02/2020   HDL 50 08/02/2020   LDLCALC 65 08/02/2020   TRIG 110 08/02/2020   CHOLHDL 2.7 08/02/2020   Lab Results  Component Value Date   HGBA1C 6.3 (H) 08/02/2020   Lab Results  Component Value Date   VITAMINB12 832 08/02/2020   Lab Results  Component Value Date   TSH <0.010 (L) 08/02/2020    MRI Brain 07/2020 Positive for a small acute on chronic lacunar infarct of the left thalamus   ASSESSMENT AND PLAN  51 y.o. year old male with vascular risk factors including hypertension, hyperlipidemia and hemophilia who is presenting after sustaining a left thalamic stroke back in May 2022.  Stroke etiology likely small vessel disease.  He is back to his normal self and denies any deficit.  On exam today he does not have any neurological deficit.  Advised patient to continue current medications, I provided a letter explaining that he does not have any deficit currently.  I will see him in 1 year for follow-up or sooner if worse.   1. Acute ischemic VBA thalamic stroke, left [facility])      Patient Instructions  Continue current medications Follow-up with your doctors Return in 1 year or sooner if worse.  No orders of the defined types were placed in this  encounter.   No orders of the defined types were placed in this encounter.   Return in about 1 year (around 10/02/2022).   Windell Norfolk, MD 10/01/2021, 9:55 AM  [facility] [address] [place], [place] [address] [phone]

## 2021-10-01 NOTE — Patient Instructions (Addendum)
Continue current medications Follow-up with your doctors Return in 1 year or sooner if worse.

## 2022-03-12 IMAGING — MR MR HEAD W/O CM
4 series · 48 of 48 positions shown · non-contrast
Comparison: Brain MRI, MRA yesterday.

CLINICAL DATA: 49-year-old male code stroke presentation yesterday.
Brain MRI yesterday negative for acute ischemia. Repeat DWI only
exam requested.

EXAM:
MRI HEAD WITHOUT CONTRAST
TECHNIQUE: Axial and coronal diffusion-weighted imaging of the brain and
surrounding structures were obtained without intravenous contrast.

[Series 2: DWI · axial · 3.0mm · 0.94mm/px · z∈[-28,+125]mm · 19 of 104 slices shown (1 of 2)]
[im 1/104]
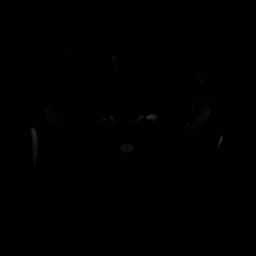
[im 6/104]
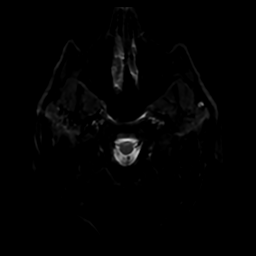
[im 12/104]
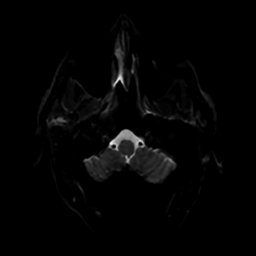
[im 18/104]
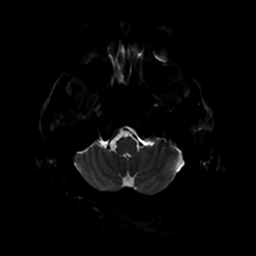
[im 23/104]
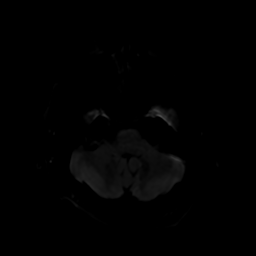
[im 29/104]
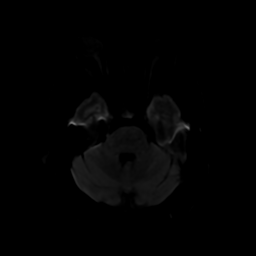
[im 35/104]
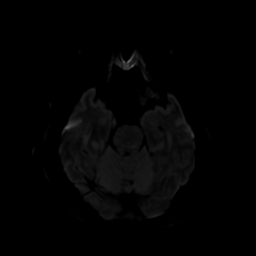
[im 41/104]
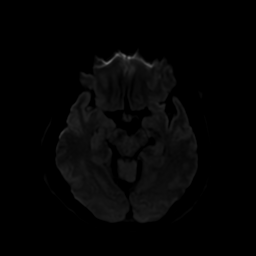
[im 46/104]
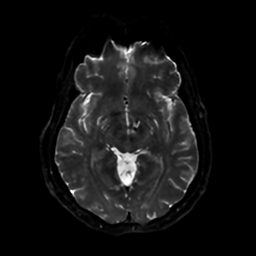
[im 52/104]
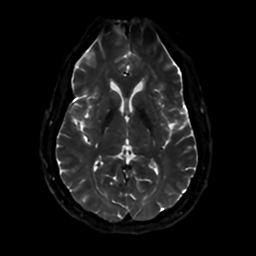
[im 58/104]
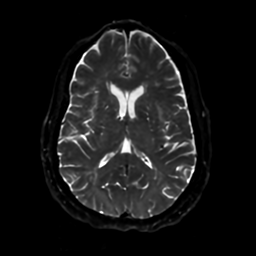
[im 63/104]
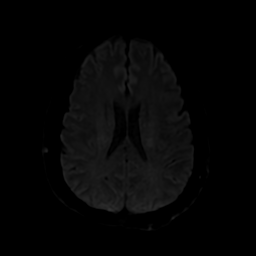
[im 69/104]
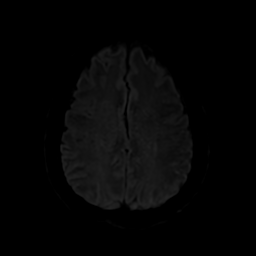
[im 75/104]
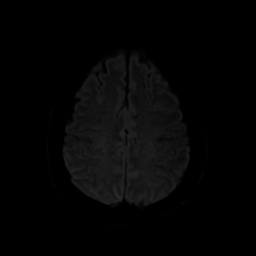
[im 81/104]
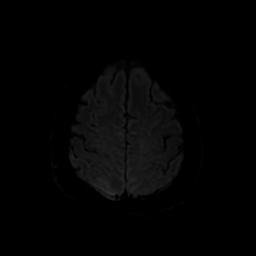
[im 86/104]
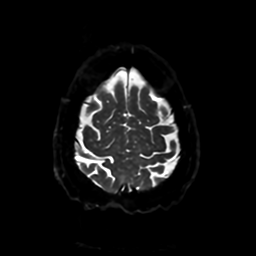
[im 92/104]
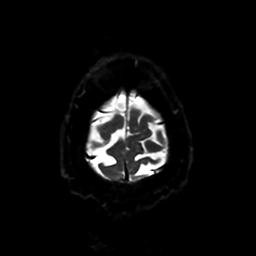
[im 98/104]
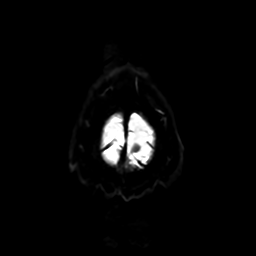
[im 104/104]
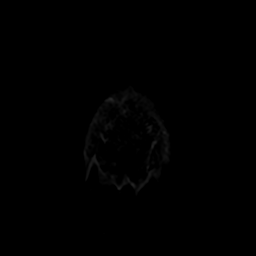

[Series 3: DWI · coronal · 4.0mm · 0.94mm/px · 13 of 73 slices shown (2 of 2)]
[im 1/73]
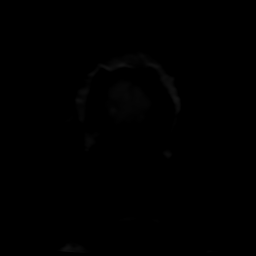
[im 7/73]
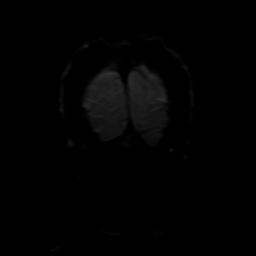
[im 13/73]
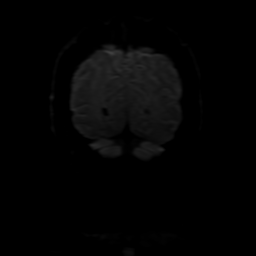
[im 19/73]
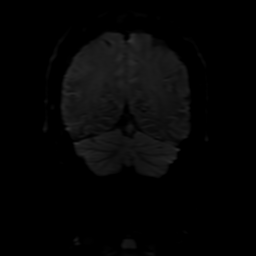
[im 25/73]
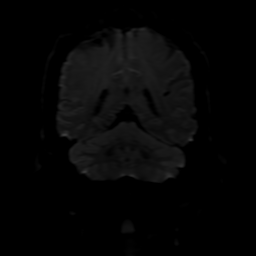
[im 31/73]
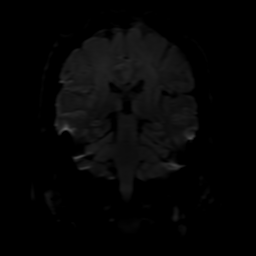
[im 37/73]
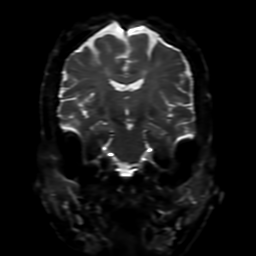
[im 43/73]
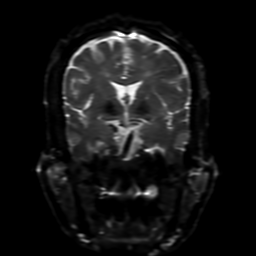
[im 49/73]
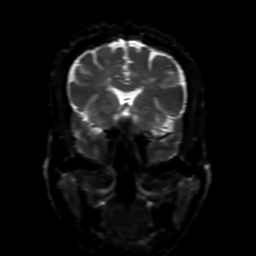
[im 55/73]
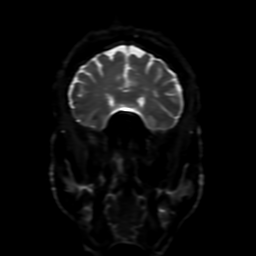
[im 61/73]
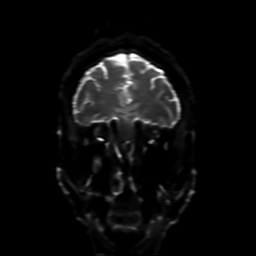
[im 67/73]
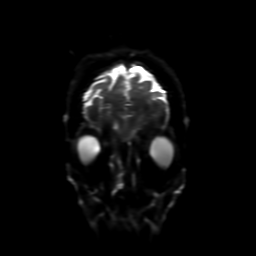
[im 73/73]
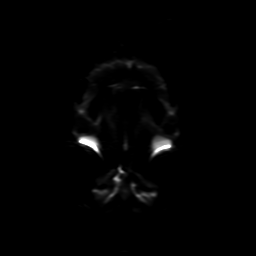

[Series 250: ADC · axial · 3.0mm · 0.94mm/px · z∈[-28,+125]mm · 9 of 52 slices shown (1 of 2)]
[im 1/52]
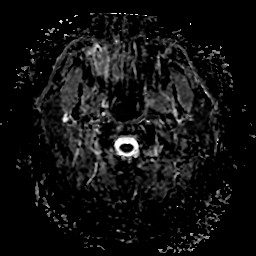
[im 7/52]
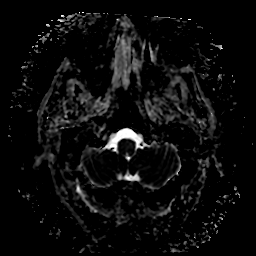
[im 13/52]
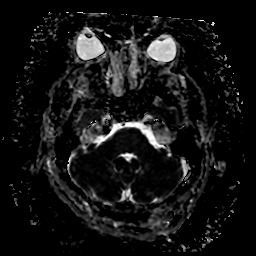
[im 20/52]
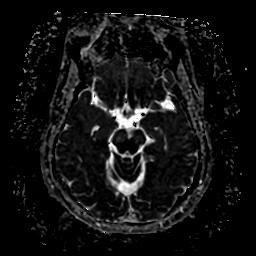
[im 26/52]
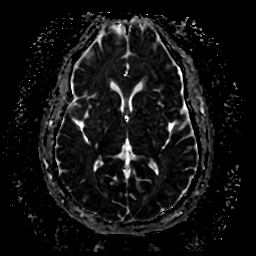
[im 32/52]
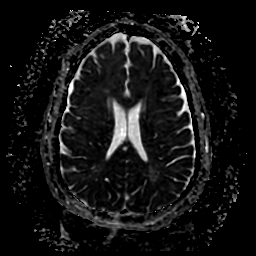
[im 39/52]
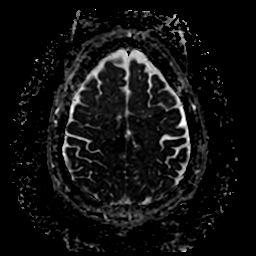
[im 45/52]
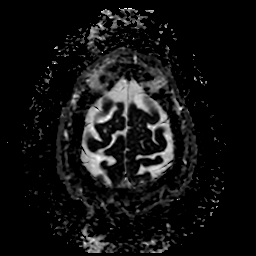
[im 52/52]
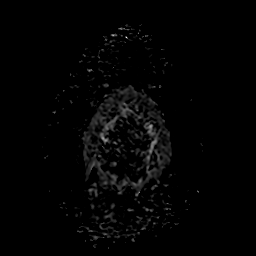

[Series 350: ADC · coronal · 4.0mm · 0.94mm/px · 7 of 37 slices shown (2 of 2)]
[im 1/37]
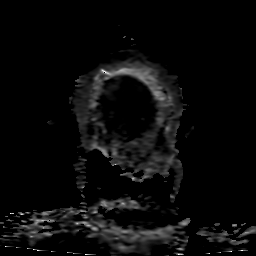
[im 7/37]
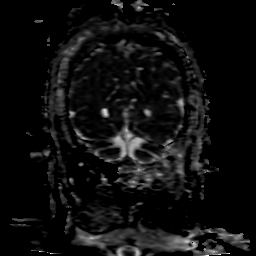
[im 13/37]
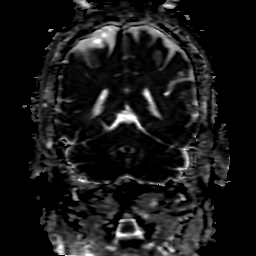
[im 19/37]
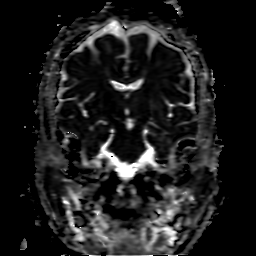
[im 25/37]
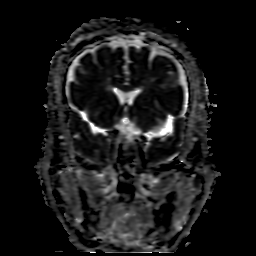
[im 31/37]
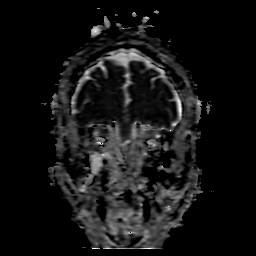
[im 37/37]
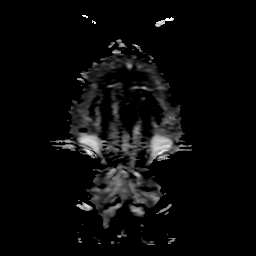

[48 of 48 positions shown; findings below may reference images not displayed]

FINDINGS: Subtle linear focus of restricted diffusion in the left thalamus is
now apparent on both series 2, image 27 and series 3, image 17.

This appears adjacent to a small chronic thalamic lacunar infarct
demonstrated yesterday (series 250, image 27 now).

No other convincing restricted diffusion. No intracranial mass
effect or ventriculomegaly.
IMPRESSION: Positive for a small acute on chronic lacunar infarct of the left
thalamus, which was occult on DWI yesterday.

## 2022-09-22 ENCOUNTER — Ambulatory Visit: Payer: Self-pay | Admitting: Neurology

## 2022-10-07 ENCOUNTER — Ambulatory Visit: Payer: Self-pay | Admitting: Neurology

## 2022-12-03 ENCOUNTER — Ambulatory Visit: Payer: Self-pay | Admitting: Neurology

## 2023-06-07 ENCOUNTER — Ambulatory Visit: Payer: Self-pay | Admitting: Neurology

## 2023-06-07 ENCOUNTER — Encounter: Payer: Self-pay | Admitting: Neurology
